# Patient Record
Sex: Female | Born: 1955 | Race: White | Hispanic: No | Marital: Married | Smoking: Never smoker
Health system: Southern US, Community
[De-identification: ages and names within clinical notes are randomized; demographics above are authoritative.]

## PROBLEM LIST (undated history)

## (undated) DIAGNOSIS — C679 Malignant neoplasm of bladder, unspecified: Secondary | ICD-10-CM

## (undated) DIAGNOSIS — T8859XA Other complications of anesthesia, initial encounter: Secondary | ICD-10-CM

## (undated) DIAGNOSIS — K635 Polyp of colon: Secondary | ICD-10-CM

## (undated) DIAGNOSIS — R0600 Dyspnea, unspecified: Secondary | ICD-10-CM

## (undated) DIAGNOSIS — Z95810 Presence of automatic (implantable) cardiac defibrillator: Secondary | ICD-10-CM

## (undated) DIAGNOSIS — F329 Major depressive disorder, single episode, unspecified: Secondary | ICD-10-CM

## (undated) DIAGNOSIS — K2970 Gastritis, unspecified, without bleeding: Secondary | ICD-10-CM

## (undated) DIAGNOSIS — K219 Gastro-esophageal reflux disease without esophagitis: Secondary | ICD-10-CM

## (undated) DIAGNOSIS — T4145XA Adverse effect of unspecified anesthetic, initial encounter: Secondary | ICD-10-CM

## (undated) DIAGNOSIS — F419 Anxiety disorder, unspecified: Secondary | ICD-10-CM

## (undated) DIAGNOSIS — I429 Cardiomyopathy, unspecified: Secondary | ICD-10-CM

## (undated) DIAGNOSIS — M1990 Unspecified osteoarthritis, unspecified site: Secondary | ICD-10-CM

## (undated) DIAGNOSIS — K5790 Diverticulosis of intestine, part unspecified, without perforation or abscess without bleeding: Secondary | ICD-10-CM

## (undated) DIAGNOSIS — I10 Essential (primary) hypertension: Secondary | ICD-10-CM

## (undated) DIAGNOSIS — K529 Noninfective gastroenteritis and colitis, unspecified: Secondary | ICD-10-CM

## (undated) DIAGNOSIS — M797 Fibromyalgia: Secondary | ICD-10-CM

## (undated) DIAGNOSIS — K8020 Calculus of gallbladder without cholecystitis without obstruction: Secondary | ICD-10-CM

## (undated) DIAGNOSIS — K5792 Diverticulitis of intestine, part unspecified, without perforation or abscess without bleeding: Secondary | ICD-10-CM

## (undated) DIAGNOSIS — Z86011 Personal history of benign neoplasm of the brain: Secondary | ICD-10-CM

## (undated) DIAGNOSIS — Z8619 Personal history of other infectious and parasitic diseases: Secondary | ICD-10-CM

## (undated) DIAGNOSIS — G4730 Sleep apnea, unspecified: Secondary | ICD-10-CM

## (undated) DIAGNOSIS — D126 Benign neoplasm of colon, unspecified: Secondary | ICD-10-CM

## (undated) DIAGNOSIS — F32A Depression, unspecified: Secondary | ICD-10-CM

## (undated) DIAGNOSIS — A048 Other specified bacterial intestinal infections: Secondary | ICD-10-CM

## (undated) DIAGNOSIS — R0609 Other forms of dyspnea: Secondary | ICD-10-CM

## (undated) DIAGNOSIS — I509 Heart failure, unspecified: Secondary | ICD-10-CM

## (undated) DIAGNOSIS — R011 Cardiac murmur, unspecified: Secondary | ICD-10-CM

## (undated) DIAGNOSIS — K589 Irritable bowel syndrome without diarrhea: Secondary | ICD-10-CM

## (undated) HISTORY — PX: ABDOMINAL HYSTERECTOMY: SHX81

## (undated) HISTORY — DX: Calculus of gallbladder without cholecystitis without obstruction: K80.20

## (undated) HISTORY — DX: Personal history of other infectious and parasitic diseases: Z86.19

## (undated) HISTORY — DX: Heart failure, unspecified: I50.9

## (undated) HISTORY — DX: Anxiety disorder, unspecified: F41.9

## (undated) HISTORY — DX: Dyspnea, unspecified: R06.00

## (undated) HISTORY — DX: Unspecified osteoarthritis, unspecified site: M19.90

## (undated) HISTORY — PX: UPPER GASTROINTESTINAL ENDOSCOPY: SHX188

## (undated) HISTORY — DX: Other specified bacterial intestinal infections: A04.8

## (undated) HISTORY — PX: COLONOSCOPY: SHX174

## (undated) HISTORY — DX: Major depressive disorder, single episode, unspecified: F32.9

## (undated) HISTORY — DX: Irritable bowel syndrome, unspecified: K58.9

## (undated) HISTORY — DX: Polyp of colon: K63.5

## (undated) HISTORY — PX: WISDOM TOOTH EXTRACTION: SHX21

## (undated) HISTORY — DX: Other forms of dyspnea: R06.09

## (undated) HISTORY — DX: Malignant neoplasm of bladder, unspecified: C67.9

## (undated) HISTORY — DX: Diverticulosis of intestine, part unspecified, without perforation or abscess without bleeding: K57.90

## (undated) HISTORY — DX: Gastritis, unspecified, without bleeding: K29.70

## (undated) HISTORY — DX: Personal history of benign neoplasm of the brain: Z86.011

## (undated) HISTORY — DX: Sleep apnea, unspecified: G47.30

## (undated) HISTORY — PX: CHOLECYSTECTOMY: SHX55

## (undated) HISTORY — DX: Diverticulitis of intestine, part unspecified, without perforation or abscess without bleeding: K57.92

## (undated) HISTORY — DX: Depression, unspecified: F32.A

## (undated) HISTORY — DX: Cardiac murmur, unspecified: R01.1

## (undated) HISTORY — DX: Gastro-esophageal reflux disease without esophagitis: K21.9

## (undated) HISTORY — DX: Benign neoplasm of colon, unspecified: D12.6

## (undated) HISTORY — PX: OTHER SURGICAL HISTORY: SHX169

## (undated) HISTORY — DX: Noninfective gastroenteritis and colitis, unspecified: K52.9

## (undated) SURGERY — COLONOSCOPY WITH ESOPHAGOGASTRODUODENOSCOPY (EGD)
Anesthesia: Monitor Anesthesia Care

---

## 1981-04-12 HISTORY — PX: CARDIAC CATHETERIZATION: SHX172

## 1999-04-13 HISTORY — PX: COLECTOMY: SHX59

## 2002-04-12 HISTORY — PX: BRAIN SURGERY: SHX531

## 2005-11-29 ENCOUNTER — Encounter: Payer: Self-pay | Admitting: Internal Medicine

## 2006-07-26 ENCOUNTER — Encounter: Payer: Self-pay | Admitting: Internal Medicine

## 2011-12-31 ENCOUNTER — Emergency Department (HOSPITAL_COMMUNITY): Payer: 59

## 2011-12-31 ENCOUNTER — Emergency Department (HOSPITAL_COMMUNITY)
Admission: EM | Admit: 2011-12-31 | Discharge: 2011-12-31 | Disposition: A | Discharge status: Home / Self Care | Payer: 59 | Attending: Emergency Medicine | Admitting: Emergency Medicine

## 2011-12-31 ENCOUNTER — Encounter (HOSPITAL_COMMUNITY): Payer: Self-pay | Admitting: Emergency Medicine

## 2011-12-31 DIAGNOSIS — Y93E1 Activity, personal bathing and showering: Secondary | ICD-10-CM | POA: Insufficient documentation

## 2011-12-31 DIAGNOSIS — W19XXXA Unspecified fall, initial encounter: Secondary | ICD-10-CM

## 2011-12-31 DIAGNOSIS — IMO0001 Reserved for inherently not codable concepts without codable children: Secondary | ICD-10-CM | POA: Insufficient documentation

## 2011-12-31 DIAGNOSIS — W1809XA Striking against other object with subsequent fall, initial encounter: Secondary | ICD-10-CM | POA: Insufficient documentation

## 2011-12-31 DIAGNOSIS — Y92009 Unspecified place in unspecified non-institutional (private) residence as the place of occurrence of the external cause: Secondary | ICD-10-CM | POA: Insufficient documentation

## 2011-12-31 DIAGNOSIS — Z9089 Acquired absence of other organs: Secondary | ICD-10-CM | POA: Insufficient documentation

## 2011-12-31 DIAGNOSIS — Z79899 Other long term (current) drug therapy: Secondary | ICD-10-CM | POA: Insufficient documentation

## 2011-12-31 DIAGNOSIS — M546 Pain in thoracic spine: Secondary | ICD-10-CM | POA: Insufficient documentation

## 2011-12-31 DIAGNOSIS — M542 Cervicalgia: Secondary | ICD-10-CM | POA: Insufficient documentation

## 2011-12-31 DIAGNOSIS — S299XXA Unspecified injury of thorax, initial encounter: Secondary | ICD-10-CM

## 2011-12-31 HISTORY — DX: Fibromyalgia: M79.7

## 2011-12-31 HISTORY — DX: Presence of automatic (implantable) cardiac defibrillator: Z95.810

## 2011-12-31 HISTORY — DX: Cardiomyopathy, unspecified: I42.9

## 2011-12-31 LAB — URINALYSIS, ROUTINE W REFLEX MICROSCOPIC
Glucose, UA: NEGATIVE mg/dL
Hgb urine dipstick: NEGATIVE
Ketones, ur: NEGATIVE mg/dL
Leukocytes, UA: NEGATIVE
Protein, ur: NEGATIVE mg/dL

## 2011-12-31 MED ORDER — CYCLOBENZAPRINE HCL 10 MG PO TABS: 10.0000 mg | ORAL_TABLET | Freq: Two times a day (BID) | ORAL | Status: DC | PRN

## 2011-12-31 NOTE — ED Provider Notes (Signed)
History     CSN: [id number]  Arrival date & time 12/31/11  1819   First MD Initiated Contact with Patient 12/31/11 1958      Chief Complaint  Patient presents with  . Fall    (Consider location/radiation/quality/duration/timing/severity/associated sxs/prior treatment) HPI...Marland Kitchenaccidental fall in the shower  and ultimately striking the toilet on the right side of her back.  No loss of consciousness. No obvious head or neck injury. Palpation makes pain worse. Severity is mild to moderate. No other associated symptoms. Accident happened a brief time ago.  Past Medical History  Diagnosis Date  . Cardiomyopathy   . ICD (implantable cardiac defibrillator) in place   . Fibromyalgia     Past Surgical History  Procedure Date  . Brain surgery   . Cholecystectomy     No family history on file.  History  Substance Use Topics  . Smoking status: Never Smoker   . Smokeless tobacco: Not on file  . Alcohol Use: No    OB History    Grav Para Term Preterm Abortions TAB SAB Ect Mult Living                  Review of Systems  All other systems reviewed and are negative.    Allergies  Codeine and Penicillins  Home Medications   Current Outpatient Rx  Name Route Sig Dispense Refill  . CARVEDILOL 25 MG PO TABS Oral Take 25 mg by mouth 2 (two) times daily with a meal.    . DULOXETINE HCL 60 MG PO CPEP Oral Take 60 mg by mouth daily.    Drema Balzarine HCL 200-25 MG PO CAPS Oral Take 2 tablets by mouth at bedtime as needed. For sleep.    Marland Kitchen LISINOPRIL 20 MG PO TABS Oral Take 20 mg by mouth 2 (two) times daily.    Marland Kitchen NAPROXEN SODIUM 220 MG PO TABS Oral Take 220 mg by mouth 2 (two) times daily as needed. For pain.    Marland Kitchen SPIRONOLACTONE 25 MG PO TABS Oral Take 25 mg by mouth daily.    . CYCLOBENZAPRINE HCL 10 MG PO TABS Oral Take 1 tablet (10 mg total) by mouth 2 (two) times daily as needed for muscle spasms. 20 tablet 0    BP 155/100  Pulse 93  Temp 98.2 F (36.8 C)   Resp 20  SpO2 97%  Physical Exam  Nursing note and vitals reviewed. Constitutional: She is oriented to person, place, and time. She appears well-developed and well-nourished.  HENT:  Head: Normocephalic and atraumatic.  Eyes: Conjunctivae normal and EOM are normal. Pupils are equal, round, and reactive to light.  Neck: Normal range of motion. Neck supple.  Cardiovascular: Normal rate, regular rhythm and normal heart sounds.   Pulmonary/Chest: Effort normal and breath sounds normal.  Abdominal: Soft. Bowel sounds are normal.  Musculoskeletal: Normal range of motion.       Minimal paraspinous tenderness from the cervical to lumbar spine.  Neurological: She is alert and oriented to person, place, and time.  Skin: Skin is warm and dry.  Psychiatric: She has a normal mood and affect.    ED Course  Procedures (including critical care time)  Labs Reviewed  URINALYSIS, ROUTINE W REFLEX MICROSCOPIC - Abnormal; Notable for the following:    Bilirubin Urine SMALL (*)     All other components within normal limits   Dg Cervical Spine Complete  12/31/2011  *RADIOLOGY REPORT*  Clinical Data: Fall, right neck pain  CERVICAL SPINE -  COMPLETE 4+ VIEW  Comparison: None.  Findings: Cervical spine is visualized to the bottom of C7 on the lateral view.  Normal cervical lordosis.  No evidence of fracture or dislocation.  Vertebral body heights are maintained.  Dens appears intact.  Lateral masses of C1 are symmetric.  No prevertebral soft tissue swelling.  Moderate degenerative changes of the lower cervical spine.  Bilateral neural foramina are patent.  Visualized lung apices are clear.  ICD leads, incompletely visualized.  IMPRESSION: No fracture or dislocation is seen.  Moderate degenerative changes.   Original Report Authenticated By: Charline Bills, M.D.    Dg Thoracic Spine 2 View  12/31/2011  *RADIOLOGY REPORT*  Clinical Data: Fall, back pain  THORACIC SPINE - 2 VIEW  Comparison: None.  Findings:  Normal thoracic kyphosis.  No evidence of fracture or dislocation.  The vertebral body heights are maintained.  Mild multilevel degenerative changes.  Left subclavian ICD, incompletely visualized.  IMPRESSION: No fracture or dislocation is seen.  Mild multilevel degenerative changes.   Original Report Authenticated By: Charline Bills, M.D.    Dg Lumbar Spine Complete  12/31/2011  *RADIOLOGY REPORT*  Clinical Data: Status post fall, back pain  LUMBAR SPINE - COMPLETE 4+ VIEW  Comparison: None.  Findings: Normal lumbar lordosis.  No evidence of fracture or dislocation.  Vertebral body heights are maintained.  Mild multilevel degenerative changes.  Visualized bony pelvis appears intact.  Surgical clips in the right upper abdomen.  Possible calcification overlying the left upper kidney, equivocal.  Moderate stool in the right colon and rectum.  IMPRESSION: No fracture or dislocation is seen.  Mild multilevel degenerative changes.   Original Report Authenticated By: Charline Bills, M.D.      1. Fall   2. Thoracic injury       MDM  Plain films of the cervical spine, thoracic spine, lumbar spine all negative for acute fracture. Vital signs are normal. No neurological deficits.        Donnetta Hutching, MD 01/07/12 [phone]

## 2011-12-31 NOTE — ED Notes (Signed)
Pt states she was trying to take a shower and she turned and slipped and hit her right side on the toilet. Pt states she did not loose consciousness and she did not hit her head. Pt is alert and oriented at this time. Pt states she has pain on her right lower back

## 2012-02-24 ENCOUNTER — Telehealth: Payer: Self-pay | Admitting: Internal Medicine

## 2012-02-24 NOTE — Telephone Encounter (Signed)
Per patient she is a relative of Keane Scrape and she was told by [employer] and [facility] would accept her and her husband as new patients and she is calling to schedule, please advise

## 2012-02-24 NOTE — Telephone Encounter (Signed)
OK. Thx

## 2012-03-01 NOTE — Telephone Encounter (Signed)
LMVM FOR PATIENT TO CALL BACK AND SCHEDULE

## 2012-03-06 NOTE — Telephone Encounter (Signed)
Left patient another voicemail to call back and schedule

## 2012-03-22 ENCOUNTER — Ambulatory Visit (INDEPENDENT_AMBULATORY_CARE_PROVIDER_SITE_OTHER): Payer: 59 | Admitting: Physician Assistant

## 2012-03-22 VITALS — BP 128/74 | HR 69 | Temp 97.9°F | Resp 16 | Ht 65.0 in | Wt 264.0 lb

## 2012-03-22 DIAGNOSIS — I428 Other cardiomyopathies: Secondary | ICD-10-CM

## 2012-03-22 DIAGNOSIS — I420 Dilated cardiomyopathy: Secondary | ICD-10-CM

## 2012-03-22 DIAGNOSIS — Z95810 Presence of automatic (implantable) cardiac defibrillator: Secondary | ICD-10-CM

## 2012-03-22 DIAGNOSIS — M79609 Pain in unspecified limb: Secondary | ICD-10-CM

## 2012-03-22 DIAGNOSIS — M79606 Pain in leg, unspecified: Secondary | ICD-10-CM

## 2012-03-22 LAB — POCT CBC
Granulocyte percent: 65.1 %G (ref 37–80)
MID (cbc): 0.6 (ref 0–0.9)
MPV: 9.7 fL (ref 0–99.8)
POC Granulocyte: 6.6 (ref 2–6.9)
POC MID %: 6.2 %M (ref 0–12)
Platelet Count, POC: 291 10*3/uL (ref 142–424)
RBC: 4.92 M/uL (ref 4.04–5.48)
RDW, POC: 15.1 %

## 2012-03-22 NOTE — Progress Notes (Signed)
Subjective:    Patient ID: Whitney Jimenez, female    DOB: Mar 29, 1956, 56 y.o.   MRN: [id number]  HPI   Whitney Jimenez is a very pleasant 56 yr old female with a complicated medical history.  She is here today due to concern for a blood clot in her leg.    She states that she has had "leg issues for quite awhile".  She has known meniscal tears, with constant pain.  Has seen Dr. Magnus Ivan because the pain in her knees and lower legs is really bad.  He recommended a course of physical therapy, which she has not done yet.  Has pain through shins.  Often feels like legs are going to give out.  Today, experienced a new pain in the anterior portion of her mid left thigh.  Has not had this pain before.  Trying to figure out what could cause it, has concern for a blood clot.  No redness or warmth of the leg.  No abnormal swelling, has baseline edema.  Has concern for PAD and has wanted to be tested, but has not done so.  Denies any personal or family history of blood clots.  No hormonal therapy.  Never smoker.  No recent travel.  Last travel was to [place] in October.  Does have chronic pain in lower legs.  States her legs ache.  Specifically denies cramping pain.  States standing will cause leg pain.  States she can only walk short distances, not able to walk from house to the curb to take out trash.  But today states that she was able to walk the dogs and do some raking in the yard.    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  Father deceased of HF, one living brother also with HF.  Pt has an ICD implanted, it was replaced in [place] within the last several months.  Denies that it has ever fired but states it has come close.  Has not had the device interrogated since the new one was implanted.  Pt has some level of chest discomfort on a daily basis, states nothing new or concerning.  Does endorse some recent SOB but states it's "nothing to write home about".    Also history of diverticular disease s/p  sigmoid colon resection.  Pt states she frequently has abd pain.    Some unintentional weight loss.  Has lost about 16 lbs this year.    Pt is new to the area, here from [place].  Trying to establish with Dr. Posey Rea as PCP, but may make an appt here at [facility] instead.  Has not established with a cardiologist locally yet.  Would like a female cardiologist if possible.    Review of Systems  Constitutional: Negative for fever and chills.  HENT: Negative.   Respiratory: Positive for shortness of breath (some recently, but "nothing to write home about") and wheezing (some when laying down). Negative for cough.   Cardiovascular: Positive for chest pain (always have some discomfort), palpitations and leg swelling.  Gastrointestinal: Positive for abdominal pain (chronic, hx diverticular disease). Negative for nausea and vomiting.  Musculoskeletal: Positive for arthralgias (knees).  Skin: Negative.   Neurological: Negative.        Objective:   Physical Exam  Vitals reviewed. Constitutional: She is oriented to person, place, and time. She appears well-developed and well-nourished. No distress.  HENT:  Head: Normocephalic and atraumatic.  Cardiovascular: Normal rate and regular rhythm.   Pulses:  Radial pulses are 2+ on the right side, and 2+ on the left side.       Dorsalis pedis pulses are 1+ on the right side, and 2+ on the left side.       Posterior tibial pulses are 1+ on the right side, and 1+ on the left side.  Pulmonary/Chest: Effort normal and breath sounds normal. She has no wheezes. She has no rhonchi. She has no rales.  Musculoskeletal:       Left upper leg: She exhibits no tenderness, no swelling and no edema.       Right lower leg: She exhibits edema. She exhibits no tenderness.       Left lower leg: She exhibits edema. She exhibits no tenderness.  Neurological: She is alert and oriented to person, place, and time.  Skin: Skin is warm and dry.  Psychiatric: She has a  normal mood and affect. Her behavior is normal.      Filed Vitals:   03/22/12 1636  BP: 128/74  Pulse: 69  Temp: 97.9 F (36.6 C)  Resp: 16  Body mass index is 43.93 kg/(m^2).     Results for orders placed in visit on 03/22/12  POCT CBC      Component Value Range   WBC 10.2  4.6 - 10.2 K/uL   Lymph, poc 2.9  0.6 - 3.4   POC LYMPH PERCENT 28.7  10 - 50 %L   MID (cbc) 0.6  0 - 0.9   POC MID % 6.2  0 - 12 %M   POC Granulocyte 6.6  2 - 6.9   Granulocyte percent 65.1  37 - 80 %G   RBC 4.92  4.04 - 5.48 M/uL   Hemoglobin 14.1  12.2 - 16.2 g/dL   HCT, POC 91.4  78.2 - 47.9 %   MCV 94.6  80 - 97 fL   MCH, POC 28.7  27 - 31.2 pg   MCHC 30.3 (*) 31.8 - 35.4 g/dL   RDW, POC 95.6     Platelet Count, POC 291  142 - 424 K/uL   MPV 9.7  0 - 99.8 fL       Assessment & Plan:   1. Leg pain  D-dimer, quantitative, [place] Extrem Low Left Comp, Ankle brachial index  2. Dilated cardiomyopathy  POCT CBC, Comprehensive metabolic panel, Ambulatory referral to Cardiology  3. ICD (implantable cardiac defibrillator) in place      Whitney Jimenez is a very pleasant 56 yr old female here today with new thigh pain that she felt was concerning for a blood clot.  Physical exam is reassuring.  Vitals are WNL, there is no redness, swelling, or TTP of the left leg.  Pain is in the anterior aspect of the thigh, not along the deep venous system.  She has no risk factors for DVT/PE.  Will do a d-dimer and lower extremity U/S to completely rule out a clot.  Her leg pain is complicated by known DJD of the knees as well as possible PVD.  Will do ABI per patient request.    Given pt's complicated medical history I encouraged her to establish with a PCP either here or elsewhere.  Additionally I put in a referral to Dr. Tenny Craw at [facility] as pt would prefer a female cardiologist.    Discussed specific RTC precautions - CP, SOB, worsening leg pain, etc.  Pt voices understanding and is in agreement with this  plan.    Spent 40 minutes  face to face with patient.

## 2012-03-22 NOTE — Patient Instructions (Addendum)
I will let you know when I have your lab results back from today -  blood count, metabolic panel, and d-dimer (which can indicate blood clot).  You will get a phone call when your ultrasound and ABI are scheduled.  I have put in a referral to Whitney Jimenez at [facility] on [facility].  If you are in need of a primary care provider, we would be happy to see you.  You can always walk in to the urgent care, or you can make an appointment next door at the appointment clinic.  I would encourage you to follow through with physical therapy for your knees, and then to follow-up with Whitney Jimenez.    If you develop new or worsening symptoms, come back in, or go to the emergency department.

## 2012-03-23 ENCOUNTER — Telehealth: Payer: Self-pay | Admitting: Radiology

## 2012-03-23 ENCOUNTER — Ambulatory Visit
Admission: RE | Admit: 2012-03-23 | Discharge: 2012-03-23 | Disposition: A | Discharge status: Home / Self Care | Payer: 59 | Source: Ambulatory Visit | Attending: Physician Assistant | Admitting: Physician Assistant

## 2012-03-23 DIAGNOSIS — M79606 Pain in leg, unspecified: Secondary | ICD-10-CM

## 2012-03-23 LAB — COMPREHENSIVE METABOLIC PANEL
ALT: 21 U/L (ref 0–35)
AST: 21 U/L (ref 0–37)
Albumin: 4 g/dL (ref 3.5–5.2)
Alkaline Phosphatase: 38 U/L — ABNORMAL LOW (ref 39–117)
Calcium: 9.2 mg/dL (ref 8.4–10.5)
Chloride: 102 mEq/L (ref 96–112)
Potassium: 4.3 mEq/L (ref 3.5–5.3)
Sodium: 141 mEq/L (ref 135–145)

## 2012-03-23 NOTE — Telephone Encounter (Signed)
Order corrected per Jenel Lucks, must be US Vascular, at hospital must be Venous Doppler, this is very confusing, same study different order.

## 2012-03-23 NOTE — Telephone Encounter (Signed)
Called patient, she needs to have her venous doppler study to r/o DVT today. I have left message for her to call me back ASAP

## 2012-03-24 NOTE — Telephone Encounter (Signed)
Patient would like someone to call her back to let her know what the next step is after the doppler study.  Best [phone]

## 2012-03-25 ENCOUNTER — Telehealth: Payer: Self-pay | Admitting: Physician Assistant

## 2012-03-25 DIAGNOSIS — M79606 Pain in leg, unspecified: Secondary | ICD-10-CM

## 2012-03-25 MED ORDER — CYCLOBENZAPRINE HCL 10 MG PO TABS: 10.0000 mg | ORAL_TABLET | Freq: Two times a day (BID) | ORAL | Status: DC | PRN

## 2012-03-25 MED ORDER — TRAMADOL HCL 50 MG PO TABS: 100.0000 mg | ORAL_TABLET | Freq: Four times a day (QID) | ORAL | Status: DC | PRN

## 2012-03-25 NOTE — Telephone Encounter (Signed)
Spoke with patient this morning.  States her thigh pain has resolved and is not bothering her any longer.  Continues to have lower leg pain.  Patient and husband were very concerned about the elevated d-dimer.  Discussed with patient that heart failure is a known cause of d-dimer elevation.  Venous U/S was completely negative for clot in the left leg, so she does not have a DVT.  Patient seemed upset that she had received an urgent call to schedule her U/S when the d-dimer came back elevated.  Apologized that we caused alarm, but explained that the person who received the critical lab value likely was not aware of her full history and just knew that she needed an ultrasound urgently to rule out clot.  Patient expressed understanding.  I have sent refills on flexeril and tramadol to the pharmacy for patient's ongoing lower leg pain.  Encouraged her to follow through with PT as they have additional pain relief modalities.  Patient voiced understanding and said she would follow through with this.  She will let us know if things are worsening or not improving.

## 2012-03-26 ENCOUNTER — Telehealth: Payer: Self-pay | Admitting: Family Medicine

## 2012-03-26 NOTE — Telephone Encounter (Signed)
Spoke to pt yesterday and documented in a telephone encounter

## 2012-03-26 NOTE — Telephone Encounter (Signed)
Called in flexeril to [facility] and left on pharmacy vm

## 2012-04-24 ENCOUNTER — Other Ambulatory Visit: Payer: 59

## 2012-04-24 ENCOUNTER — Other Ambulatory Visit (INDEPENDENT_AMBULATORY_CARE_PROVIDER_SITE_OTHER): Payer: 59

## 2012-04-24 ENCOUNTER — Encounter: Payer: Self-pay | Admitting: Internal Medicine

## 2012-04-24 ENCOUNTER — Ambulatory Visit (INDEPENDENT_AMBULATORY_CARE_PROVIDER_SITE_OTHER): Payer: 59 | Admitting: Internal Medicine

## 2012-04-24 VITALS — BP 118/70 | HR 72 | Temp 98.1°F | Ht 64.5 in | Wt 262.0 lb

## 2012-04-24 DIAGNOSIS — M79609 Pain in unspecified limb: Secondary | ICD-10-CM

## 2012-04-24 DIAGNOSIS — I420 Dilated cardiomyopathy: Secondary | ICD-10-CM

## 2012-04-24 DIAGNOSIS — D329 Benign neoplasm of meninges, unspecified: Secondary | ICD-10-CM | POA: Insufficient documentation

## 2012-04-24 DIAGNOSIS — G43909 Migraine, unspecified, not intractable, without status migrainosus: Secondary | ICD-10-CM

## 2012-04-24 DIAGNOSIS — IMO0001 Reserved for inherently not codable concepts without codable children: Secondary | ICD-10-CM

## 2012-04-24 DIAGNOSIS — K5730 Diverticulosis of large intestine without perforation or abscess without bleeding: Secondary | ICD-10-CM

## 2012-04-24 DIAGNOSIS — F32A Depression, unspecified: Secondary | ICD-10-CM

## 2012-04-24 DIAGNOSIS — L409 Psoriasis, unspecified: Secondary | ICD-10-CM

## 2012-04-24 DIAGNOSIS — K5790 Diverticulosis of intestine, part unspecified, without perforation or abscess without bleeding: Secondary | ICD-10-CM

## 2012-04-24 DIAGNOSIS — M797 Fibromyalgia: Secondary | ICD-10-CM

## 2012-04-24 DIAGNOSIS — L408 Other psoriasis: Secondary | ICD-10-CM

## 2012-04-24 DIAGNOSIS — Z23 Encounter for immunization: Secondary | ICD-10-CM

## 2012-04-24 DIAGNOSIS — D320 Benign neoplasm of cerebral meninges: Secondary | ICD-10-CM

## 2012-04-24 DIAGNOSIS — I428 Other cardiomyopathies: Secondary | ICD-10-CM

## 2012-04-24 DIAGNOSIS — M79606 Pain in leg, unspecified: Secondary | ICD-10-CM

## 2012-04-24 DIAGNOSIS — F329 Major depressive disorder, single episode, unspecified: Secondary | ICD-10-CM

## 2012-04-24 LAB — CBC WITH DIFFERENTIAL/PLATELET
Basophils Relative: 0.3 % (ref 0.0–3.0)
Eosinophils Absolute: 0.2 10*3/uL (ref 0.0–0.7)
Eosinophils Relative: 2.1 % (ref 0.0–5.0)
HCT: 41.2 % (ref 36.0–46.0)
Lymphs Abs: 2.2 10*3/uL (ref 0.7–4.0)
MCHC: 33.7 g/dL (ref 30.0–36.0)
MCV: 88.7 fl (ref 78.0–100.0)
Monocytes Absolute: 0.5 10*3/uL (ref 0.1–1.0)
Neutrophils Relative %: 66.7 % (ref 43.0–77.0)
Platelets: 226 10*3/uL (ref 150.0–400.0)

## 2012-04-24 LAB — LIPID PANEL
Cholesterol: 166 mg/dL (ref 0–200)
Total CHOL/HDL Ratio: 4
Triglycerides: 91 mg/dL (ref 0.0–149.0)
VLDL: 18.2 mg/dL (ref 0.0–40.0)

## 2012-04-24 LAB — BASIC METABOLIC PANEL
BUN: 19 mg/dL (ref 6–23)
Calcium: 9.1 mg/dL (ref 8.4–10.5)
GFR: 83.05 mL/min (ref >60.00)
Glucose, Bld: 130 mg/dL — ABNORMAL HIGH (ref 70–99)
Potassium: 4.2 mEq/L (ref 3.5–5.1)

## 2012-04-24 LAB — HEPATIC FUNCTION PANEL
ALT: 21 U/L (ref 0–35)
AST: 21 U/L (ref 0–37)
Albumin: 3.7 g/dL (ref 3.5–5.2)
Total Bilirubin: 0.4 mg/dL (ref 0.3–1.2)
Total Protein: 7.3 g/dL (ref 6.0–8.3)

## 2012-04-24 LAB — VITAMIN B12: Vitamin B-12: 666 pg/mL (ref 211–911)

## 2012-04-24 LAB — URINALYSIS
Bilirubin Urine: NEGATIVE
Hgb urine dipstick: NEGATIVE
Leukocytes, UA: NEGATIVE
Nitrite: NEGATIVE

## 2012-04-24 MED ORDER — TRAMADOL HCL 50 MG PO TABS: 100.0000 mg | ORAL_TABLET | Freq: Three times a day (TID) | ORAL | Status: DC | PRN

## 2012-04-24 MED ORDER — VITAMIN D 1000 UNITS PO TABS: 1000.0000 [IU] | ORAL_TABLET | Freq: Every day | ORAL | Status: DC

## 2012-04-24 NOTE — Assessment & Plan Note (Signed)
Continue with current prescription therapy as reflected on the Med list.  

## 2012-04-24 NOTE — Assessment & Plan Note (Signed)
She was d/c'd from [facility] care after 5 years

## 2012-04-24 NOTE — Progress Notes (Signed)
  Subjective:    Patient ID: Whitney Jimenez, female    DOB: [birth year], 57 y.o.   MRN: [id number]  HPI  New pt  C/o cardiomyopathy /hereditary - ICD since 2001 Dr Tenny Craw; CHF She has a h/o brain tumor C/o fibromyalgia sx's  X long time - not well controlled   Review of Systems  Constitutional: Negative for chills, activity change, appetite change, fatigue and unexpected weight change.  HENT: Negative for congestion, mouth sores and sinus pressure.   Eyes: Negative for visual disturbance.  Respiratory: Negative for cough and chest tightness.   Gastrointestinal: Negative for nausea and abdominal pain.  Genitourinary: Negative for frequency, difficulty urinating and vaginal pain.  Musculoskeletal: Negative for back pain and gait problem.  Skin: Negative for pallor and rash.  Neurological: Negative for dizziness, tremors, weakness, numbness and headaches.  Psychiatric/Behavioral: Negative for confusion and sleep disturbance.       Objective:   Physical Exam  Constitutional: She appears well-developed. No distress.       obese  HENT:  Head: Normocephalic.  Right Ear: External ear normal.  Left Ear: External ear normal.  Nose: Nose normal.  Mouth/Throat: Oropharynx is clear and moist.  Eyes: Conjunctivae normal are normal. Pupils are equal, round, and reactive to light. Right eye exhibits no discharge. Left eye exhibits no discharge.  Neck: Normal range of motion. Neck supple. No JVD present. No tracheal deviation present. No thyromegaly present.  Cardiovascular: Normal rate, regular rhythm and normal heart sounds.   Pulmonary/Chest: No stridor. No respiratory distress. She has no wheezes.  Abdominal: Soft. Bowel sounds are normal. She exhibits no distension and no mass. There is no tenderness. There is no rebound and no guarding.  Musculoskeletal: She exhibits no edema and no tenderness.  Lymphadenopathy:    She has no cervical adenopathy.  Neurological: She displays normal  reflexes. No cranial nerve deficit. She exhibits normal muscle tone. Coordination normal.  Skin: No rash noted. No erythema.  Psychiatric: She has a normal mood and affect. Her behavior is normal. Judgment and thought content normal.    Lab Results  Component Value Date   WBC 8.7 04/24/2012   HGB 13.9 04/24/2012   HCT 41.2 04/24/2012   PLT 226.0 04/24/2012   GLUCOSE 130* 04/24/2012   CHOL 166 04/24/2012   TRIG 91.0 04/24/2012   HDL 39.10 04/24/2012   LDLCALC 109* 04/24/2012   ALT 21 04/24/2012   AST 21 04/24/2012   NA 140 04/24/2012   K 4.2 04/24/2012   CL 105 04/24/2012   CREATININE 0.8 04/24/2012   BUN 19 04/24/2012   CO2 28 04/24/2012   TSH 0.95 04/24/2012    Chart reviewed      Assessment & Plan:

## 2012-04-24 NOTE — Assessment & Plan Note (Signed)
F/u w/Dr Tenny Craw Continue with current prescription therapy as reflected on the Med list.

## 2012-04-24 NOTE — Assessment & Plan Note (Signed)
Vit D Tramadol prn Cymbalta daily Labs

## 2012-04-25 ENCOUNTER — Telehealth: Payer: Self-pay | Admitting: Internal Medicine

## 2012-04-25 DIAGNOSIS — E559 Vitamin D deficiency, unspecified: Secondary | ICD-10-CM

## 2012-04-25 MED ORDER — ERGOCALCIFEROL 1.25 MG (50000 UT) PO CAPS: 50000.0000 [IU] | ORAL_CAPSULE | ORAL | Status: DC

## 2012-04-25 NOTE — Telephone Encounter (Signed)
Left mess for patient to call back.  

## 2012-04-25 NOTE — Telephone Encounter (Signed)
Whitney Jimenez, please, inform patient that all labs are normal except for low  Vit D - stat Rx and elev gl - loose wt Thx

## 2012-04-26 ENCOUNTER — Telehealth: Payer: Self-pay | Admitting: *Deleted

## 2012-04-26 NOTE — Telephone Encounter (Signed)
PATIENT NOTIFIED OF LAB RESULTS

## 2012-04-28 ENCOUNTER — Encounter: Payer: Self-pay | Admitting: Internal Medicine

## 2012-04-28 NOTE — Telephone Encounter (Signed)
Pt informed of below by Hoy Register, LPN

## 2012-05-01 ENCOUNTER — Ambulatory Visit (INDEPENDENT_AMBULATORY_CARE_PROVIDER_SITE_OTHER): Payer: 59 | Admitting: *Deleted

## 2012-05-01 ENCOUNTER — Telehealth: Payer: Self-pay | Admitting: Internal Medicine

## 2012-05-01 ENCOUNTER — Encounter: Payer: Self-pay | Admitting: Internal Medicine

## 2012-05-01 ENCOUNTER — Ambulatory Visit (INDEPENDENT_AMBULATORY_CARE_PROVIDER_SITE_OTHER): Payer: 59 | Admitting: Internal Medicine

## 2012-05-01 VITALS — BP 140/80 | HR 64 | Resp 20 | Ht 65.0 in | Wt 264.0 lb

## 2012-05-01 DIAGNOSIS — I428 Other cardiomyopathies: Secondary | ICD-10-CM

## 2012-05-01 DIAGNOSIS — I422 Other hypertrophic cardiomyopathy: Secondary | ICD-10-CM

## 2012-05-01 DIAGNOSIS — R0602 Shortness of breath: Secondary | ICD-10-CM

## 2012-05-01 DIAGNOSIS — I420 Dilated cardiomyopathy: Secondary | ICD-10-CM

## 2012-05-01 LAB — BASIC METABOLIC PANEL
BUN: 20 mg/dL (ref 6–23)
Calcium: 9.5 mg/dL (ref 8.4–10.5)
Creatinine, Ser: 0.8 mg/dL (ref 0.4–1.2)
GFR: 81.8 mL/min (ref >60.00)
Glucose, Bld: 115 mg/dL — ABNORMAL HIGH (ref 70–99)

## 2012-05-01 LAB — BRAIN NATRIURETIC PEPTIDE: Pro B Natriuretic peptide (BNP): 46 pg/mL (ref 0.0–100.0)

## 2012-05-01 NOTE — Telephone Encounter (Signed)
ROI faxed to [facility] in [place]  @ the [facility] Location [phone] ( MR Fax) 05/01/12/KM

## 2012-05-01 NOTE — Patient Instructions (Signed)
Lab work today We will call you with results.  Schedule new patient appointment with Dr.Taylor for device check in 3 months

## 2012-05-01 NOTE — Progress Notes (Signed)
HPI Patient is a 57 yo who is referred for continued cardiac care She recently moved to [place] from [place]. She has a history of NICM  She was diagnosed the the early 2000s.  At that time, in a preop eval, her heart was noted to be larger on Xray  She went on to have an echo that reportedly showed a depressed EF  Records are currently not available  She was placed on medicines  In 2001 she had an AICD placed.  The was recalled and changed to a [employer] device.  She went through her 3rd change out in NOvember. She has never had a CHF exacerbation.   She denies CP  No signif SOB  She has noted recently feeling her hart beat harder at night, not faster. She had one episode in November of falling on bed  Not sure if she just fell asleep  Does not remember device firing  Thinks it has paced at times.  She reports in 1982 undergoing surg  Told she devel a rate related R block while on table.  Was lost transiently   Allergies  Allergen Reactions  . Codeine Nausea And Vomiting  . Penicillins Nausea And Vomiting    Current Outpatient Prescriptions  Medication Sig Dispense Refill  . carvedilol (COREG) 25 MG tablet Take 25 mg by mouth 2 (two) times daily with a meal.      . clobetasol ointment (TEMOVATE) 0.05 % Apply topically. As needed      . cyclobenzaprine (FLEXERIL) 10 MG tablet Take 1 tablet (10 mg total) by mouth 2 (two) times daily as needed for muscle spasms.  20 tablet  0  . desonide (DESOWEN) 0.05 % ointment As needed      . DULoxetine (CYMBALTA) 60 MG capsule Take 60 mg by mouth daily.      . ergocalciferol (VITAMIN D2) 50000 UNITS capsule Take 1 capsule (50,000 Units total) by mouth once a week.  6 capsule  0  . fluocinonide ointment (LIDEX) 0.05 % Apply topically. Solution as needed      . furosemide (LASIX) 20 MG tablet Take 20 mg by mouth daily.      . Ibuprofen-Diphenhydramine HCl 200-25 MG CAPS Take 2 tablets by mouth at bedtime as needed. For sleep.      Marland Kitchen lisinopril  (PRINIVIL,ZESTRIL) 20 MG tablet Take 20 mg by mouth 2 (two) times daily.      . metroNIDAZOLE (FLAGYL) 500 MG tablet Take  As directed if needed      . naproxen sodium (ANAPROX) 220 MG tablet Take 220 mg by mouth 2 (two) times daily as needed. For pain.      Marland Kitchen spironolactone (ALDACTONE) 25 MG tablet Take 25 mg by mouth daily.      . traMADol (ULTRAM) 50 MG tablet Take 2 tablets (100 mg total) by mouth every 8 (eight) hours as needed for pain.  100 tablet  2    Past Medical History  Diagnosis Date  . Cardiomyopathy   . ICD (implantable cardiac defibrillator) in place   . Fibromyalgia   . CHF (congestive heart failure)   . Heart murmur     Past Surgical History  Procedure Date  . Brain surgery 2004    Tumor  . Cholecystectomy   . Abdominal hysterectomy   . Cardiac catheterization 1983  . Colectomy 2001    sigmoid    History reviewed. No pertinent family history.  History   Social History  . Marital Status:  Married    Spouse Name: N/A    Number of Children: N/A  . Years of Education: N/A   Occupational History  . Not on file.   Social History Main Topics  . Smoking status: Never Smoker   . Smokeless tobacco: Not on file  . Alcohol Use: No  . Drug Use: No  . Sexually Active:    Other Topics Concern  . Not on file   Social History Narrative  . No narrative on file    Review of Systems:  All systems reviewed.  They are negative to the above problem except as previously stated.  Vital Signs: BP 140/80  Pulse 64  Resp 20  Ht 5\' 5"  (1.651 m)  Wt 264 lb (119.75 kg)  BMI 43.93 kg/m2  Physical Exam Patient is a morbidly obese 57 yo in NAD HEENT:  Normocephalic, atraumatic. EOMI, PERRLA.  Neck: JVP is normal.  No bruits.  Lungs: clear to auscultation. No rales no wheezes.  Heart: Regular rate and rhythm. Normal S1, S2. No S3.   No significant murmurs. PMI not displaced.  Abdomen:  Supple, nontender. Normal bowel sounds. No masses. No hepatomegaly.   Extremities:   Good distal pulses throughout. No lower extremity edema.  Musculoskeletal :moving all extremities.  Neuro:   alert and oriented x3.  CN II-XII grossly intact.  EKG  SR 64  LBBB. Assessment and Plan:  1.  Cardiomyopathy.  Need to get records   Appears to be familial as other members have.  She says that genetic testing has not been done  Volume status looks good,  Will get labs today esp since BP is up  Will net sched other tests for now.  2.  ICD  Device interrogated.  No hx of firing.  Does show that her impedence has been up and now is improving.  Patient will be set up in device clnic in 3 months with Dr .Ladona Ridgel  3.  Health care maintenance  Will need to check on lipids.  Need to counsel on wt.

## 2012-05-02 ENCOUNTER — Encounter: Payer: Self-pay | Admitting: Internal Medicine

## 2012-05-02 ENCOUNTER — Other Ambulatory Visit (INDEPENDENT_AMBULATORY_CARE_PROVIDER_SITE_OTHER): Payer: 59

## 2012-05-02 ENCOUNTER — Telehealth: Payer: Self-pay | Admitting: Internal Medicine

## 2012-05-02 ENCOUNTER — Other Ambulatory Visit: Payer: Self-pay | Admitting: *Deleted

## 2012-05-02 ENCOUNTER — Ambulatory Visit (INDEPENDENT_AMBULATORY_CARE_PROVIDER_SITE_OTHER)
Admission: RE | Admit: 2012-05-02 | Discharge: 2012-05-02 | Disposition: A | Discharge status: Home / Self Care | Payer: 59 | Source: Ambulatory Visit | Attending: Internal Medicine | Admitting: Internal Medicine

## 2012-05-02 DIAGNOSIS — I422 Other hypertrophic cardiomyopathy: Secondary | ICD-10-CM

## 2012-05-02 LAB — ICD DEVICE OBSERVATION
AL IMPEDENCE ICD: 532 Ohm
AL THRESHOLD: 1 V
ATRIAL PACING ICD: 9.1 pct
DEV-0020ICD: NEGATIVE
RV LEAD IMPEDENCE ICD: 589 Ohm
RV LEAD THRESHOLD: 0.5 V
TZON-0003FASTVT: 270.2 ms

## 2012-05-02 NOTE — Telephone Encounter (Signed)
Records Received From [facility] Gave to [facility] 05/02/12/KM

## 2012-05-02 NOTE — Progress Notes (Signed)
icd check in clinic/new pt/seeing dr Tenny Craw

## 2012-05-03 ENCOUNTER — Telehealth: Payer: Self-pay | Admitting: Internal Medicine

## 2012-05-03 NOTE — Telephone Encounter (Signed)
Pt was given results re test and has further questions

## 2012-05-04 ENCOUNTER — Ambulatory Visit (HOSPITAL_COMMUNITY): Payer: 59 | Attending: Cardiology | Admitting: Radiology

## 2012-05-04 ENCOUNTER — Other Ambulatory Visit: Payer: Self-pay

## 2012-05-04 DIAGNOSIS — I080 Rheumatic disorders of both mitral and aortic valves: Secondary | ICD-10-CM | POA: Insufficient documentation

## 2012-05-04 DIAGNOSIS — I422 Other hypertrophic cardiomyopathy: Secondary | ICD-10-CM

## 2012-05-04 DIAGNOSIS — R011 Cardiac murmur, unspecified: Secondary | ICD-10-CM | POA: Insufficient documentation

## 2012-05-04 DIAGNOSIS — I509 Heart failure, unspecified: Secondary | ICD-10-CM | POA: Insufficient documentation

## 2012-05-04 DIAGNOSIS — I428 Other cardiomyopathies: Secondary | ICD-10-CM | POA: Insufficient documentation

## 2012-05-04 NOTE — Progress Notes (Signed)
Echocardiogram performed.  

## 2012-05-09 ENCOUNTER — Telehealth: Payer: Self-pay | Admitting: Internal Medicine

## 2012-05-09 NOTE — Telephone Encounter (Signed)
2nd Set of Records received Via Mail Gave to [facility] 05/09/12/KM

## 2012-05-10 ENCOUNTER — Other Ambulatory Visit: Payer: Self-pay | Admitting: *Deleted

## 2012-05-10 DIAGNOSIS — R7989 Other specified abnormal findings of blood chemistry: Secondary | ICD-10-CM

## 2012-05-10 NOTE — Progress Notes (Signed)
Pt given results of ECHO and Labs.  Will schedule for V/Q scan per Dr. Tenny Craw.

## 2012-05-11 ENCOUNTER — Encounter (HOSPITAL_COMMUNITY): Payer: Self-pay

## 2012-05-11 ENCOUNTER — Ambulatory Visit (HOSPITAL_COMMUNITY)
Admission: RE | Admit: 2012-05-11 | Discharge: 2012-05-11 | Disposition: A | Discharge status: Home / Self Care | Payer: 59 | Source: Ambulatory Visit | Attending: Internal Medicine | Admitting: Internal Medicine

## 2012-05-11 DIAGNOSIS — Z95810 Presence of automatic (implantable) cardiac defibrillator: Secondary | ICD-10-CM | POA: Insufficient documentation

## 2012-05-11 DIAGNOSIS — R7989 Other specified abnormal findings of blood chemistry: Secondary | ICD-10-CM

## 2012-05-11 DIAGNOSIS — I422 Other hypertrophic cardiomyopathy: Secondary | ICD-10-CM | POA: Insufficient documentation

## 2012-05-11 DIAGNOSIS — R0602 Shortness of breath: Secondary | ICD-10-CM | POA: Insufficient documentation

## 2012-05-11 DIAGNOSIS — R799 Abnormal finding of blood chemistry, unspecified: Secondary | ICD-10-CM | POA: Insufficient documentation

## 2012-05-11 MED ORDER — TECHNETIUM TO 99M ALBUMIN AGGREGATED
4.9000 | Freq: Once | INTRAVENOUS | Status: AC | PRN
  Administered 2012-05-11: 4.9 via INTRAVENOUS

## 2012-05-11 MED ORDER — TECHNETIUM TC 99M DIETHYLENETRIAME-PENTAACETIC ACID: 42.3000 | Freq: Once | INTRAVENOUS | Status: AC | PRN

## 2012-05-12 NOTE — Telephone Encounter (Signed)
Pt advised that we have not received her VQ scan results yet and that we will contact her with results when they become available, she verbalized understanding.

## 2012-05-12 NOTE — Telephone Encounter (Signed)
Follow up on call     VQ scan done at [facility] long on yesterday calling for test results .

## 2012-05-15 ENCOUNTER — Telehealth: Payer: Self-pay | Admitting: Internal Medicine

## 2012-05-15 NOTE — Telephone Encounter (Signed)
Pt calling re test results and to find out if any further testing needs to be done

## 2012-05-28 ENCOUNTER — Other Ambulatory Visit: Payer: Self-pay

## 2012-06-16 ENCOUNTER — Ambulatory Visit: Payer: 59 | Admitting: Internal Medicine

## 2012-07-25 ENCOUNTER — Ambulatory Visit (INDEPENDENT_AMBULATORY_CARE_PROVIDER_SITE_OTHER): Payer: 59 | Admitting: Internal Medicine

## 2012-07-25 ENCOUNTER — Encounter: Payer: Self-pay | Admitting: Internal Medicine

## 2012-07-25 VITALS — BP 128/70 | HR 72 | Temp 98.2°F | Resp 16 | Wt 264.0 lb

## 2012-07-25 DIAGNOSIS — I420 Dilated cardiomyopathy: Secondary | ICD-10-CM

## 2012-07-25 DIAGNOSIS — IMO0001 Reserved for inherently not codable concepts without codable children: Secondary | ICD-10-CM

## 2012-07-25 DIAGNOSIS — E559 Vitamin D deficiency, unspecified: Secondary | ICD-10-CM

## 2012-07-25 DIAGNOSIS — F329 Major depressive disorder, single episode, unspecified: Secondary | ICD-10-CM

## 2012-07-25 DIAGNOSIS — M797 Fibromyalgia: Secondary | ICD-10-CM

## 2012-07-25 DIAGNOSIS — I428 Other cardiomyopathies: Secondary | ICD-10-CM

## 2012-07-25 NOTE — Assessment & Plan Note (Signed)
Treated Cont Rx

## 2012-07-25 NOTE — Assessment & Plan Note (Signed)
Continue with current prescription therapy as reflected on the Med list.  

## 2012-07-25 NOTE — Progress Notes (Signed)
   Subjective:    HPI    C/o cardiomyopathy /hereditary - ICD since 2001 Dr Tenny Craw; CHF, elev glucose, vit D def She has a h/o brain tumor C/o fibromyalgia sx's  x long time - not well controlled  Wt Readings from Last 3 Encounters:  07/25/12 264 lb (119.75 kg)  05/01/12 264 lb (119.75 kg)  04/24/12 262 lb (118.842 kg)   BP Readings from Last 3 Encounters:  07/25/12 128/70  05/01/12 140/80  04/24/12 118/70      Review of Systems  Constitutional: Negative for chills, activity change, appetite change, fatigue and unexpected weight change.  HENT: Negative for congestion, mouth sores and sinus pressure.   Eyes: Negative for visual disturbance.  Respiratory: Negative for cough and chest tightness.   Gastrointestinal: Negative for nausea and abdominal pain.  Genitourinary: Negative for frequency, difficulty urinating and vaginal pain.  Musculoskeletal: Negative for back pain and gait problem.  Skin: Negative for pallor and rash.  Neurological: Negative for dizziness, tremors, weakness, numbness and headaches.  Psychiatric/Behavioral: Negative for confusion and sleep disturbance.       Objective:   Physical Exam  Constitutional: She appears well-developed. No distress.  obese  HENT:  Head: Normocephalic.  Right Ear: External ear normal.  Left Ear: External ear normal.  Nose: Nose normal.  Mouth/Throat: Oropharynx is clear and moist.  Eyes: Conjunctivae are normal. Pupils are equal, round, and reactive to light. Right eye exhibits no discharge. Left eye exhibits no discharge.  Neck: Normal range of motion. Neck supple. No JVD present. No tracheal deviation present. No thyromegaly present.  Cardiovascular: Normal rate, regular rhythm and normal heart sounds.   Pulmonary/Chest: No stridor. No respiratory distress. She has no wheezes.  Abdominal: Soft. Bowel sounds are normal. She exhibits no distension and no mass. There is no tenderness. There is no rebound and no guarding.   Musculoskeletal: She exhibits no edema and no tenderness.  Lymphadenopathy:    She has no cervical adenopathy.  Neurological: She displays normal reflexes. No cranial nerve deficit. She exhibits normal muscle tone. Coordination normal.  Skin: No rash noted. No erythema.  Psychiatric: She has a normal mood and affect. Her behavior is normal. Judgment and thought content normal.    Lab Results  Component Value Date   WBC 8.7 04/24/2012   HGB 13.9 04/24/2012   HCT 41.2 04/24/2012   PLT 226.0 04/24/2012   GLUCOSE 115* 05/01/2012   CHOL 166 04/24/2012   TRIG 91.0 04/24/2012   HDL 39.10 04/24/2012   LDLCALC 109* 04/24/2012   ALT 21 04/24/2012   AST 21 04/24/2012   NA 138 05/01/2012   K 4.2 05/01/2012   CL 100 05/01/2012   CREATININE 0.8 05/01/2012   BUN 20 05/01/2012   CO2 30 05/01/2012   TSH 0.95 04/24/2012   HGBA1C 6.2 05/01/2012    Chart reviewed      Assessment & Plan:

## 2012-08-03 ENCOUNTER — Ambulatory Visit (INDEPENDENT_AMBULATORY_CARE_PROVIDER_SITE_OTHER): Payer: 59 | Admitting: Internal Medicine

## 2012-08-03 VITALS — BP 128/74 | HR 61 | Ht 65.0 in | Wt 271.0 lb

## 2012-08-03 DIAGNOSIS — I420 Dilated cardiomyopathy: Secondary | ICD-10-CM

## 2012-08-03 DIAGNOSIS — I428 Other cardiomyopathies: Secondary | ICD-10-CM

## 2012-08-03 LAB — LIPID PANEL
Cholesterol: 164 mg/dL (ref 0–200)
LDL Cholesterol: 102 mg/dL — ABNORMAL HIGH (ref 0–99)
Triglycerides: 140 mg/dL (ref 0.0–149.0)
VLDL: 28 mg/dL (ref 0.0–40.0)

## 2012-08-03 LAB — BASIC METABOLIC PANEL
Chloride: 101 mEq/L (ref 96–112)
Potassium: 4.2 mEq/L (ref 3.5–5.1)

## 2012-08-03 NOTE — Progress Notes (Signed)
HPI Patient is a 57 yo with a history of NICM  I saw her for the first time earlier this winter  SHe had moved her from [place] She was diagnosed with CHF in 2000.  LVEF was initially in 20s  She has had a cardiac cath in 2009 which showd no CAD.  Last echo LVEF was 45% on echo in Jan 2013 She has known LBBB (old, present by report in 1082)  She has had 3 AICDs.  First rplaced due to faulty lead in 2005.  Last was put in in Fall 2013.  (ERI) She had never had a CHF exacerbation  When I saw her earlier this winter her volume status looked good After seeing her I recomm an echo  This was done and showed an LVEF of 25 to 30%. The patient says she does get tired/SOB easy now.  She also notes her heart pounding, especially in bed.  Difficult history  I do not sense that spells prolonged nor are they associated with dizziness  Gets a little SOB with them.  Happening more frequatnly  She was very surprised to hear her echo results  Worried  Wondering if she will ever need an LVAD  When seen on last visit a repeat D Dimer was done  This was increased.  ANA and ESR were without signif abnormality.  V/Q scan was negative Optomitrist commented on cotton wool spots.  Retinal specialist was not impressed with dx.   Allergies  Allergen Reactions  . Codeine Nausea And Vomiting  . Penicillins Nausea And Vomiting    Current Outpatient Prescriptions  Medication Sig Dispense Refill  . carvedilol (COREG) 25 MG tablet Take 25 mg by mouth 2 (two) times daily with a meal.      . clobetasol ointment (TEMOVATE) 0.05 % Apply topically. As needed      . cyclobenzaprine (FLEXERIL) 10 MG tablet Take 1 tablet (10 mg total) by mouth 2 (two) times daily as needed for muscle spasms.  20 tablet  0  . desonide (DESOWEN) 0.05 % ointment As needed      . DULoxetine (CYMBALTA) 60 MG capsule Take 60 mg by mouth daily.      . fluocinonide ointment (LIDEX) 0.05 % Apply topically. Solution as needed      . furosemide (LASIX) 20 MG tablet  Take 20 mg by mouth daily.      Marland Kitchen lisinopril (PRINIVIL,ZESTRIL) 20 MG tablet Take 20 mg by mouth 2 (two) times daily.      . metroNIDAZOLE (FLAGYL) 500 MG tablet Take  As directed if needed      . naproxen sodium (ANAPROX) 220 MG tablet Take 220 mg by mouth 2 (two) times daily as needed. For pain.      Marland Kitchen spironolactone (ALDACTONE) 25 MG tablet Take 25 mg by mouth daily.      . traMADol (ULTRAM) 50 MG tablet Take 2 tablets (100 mg total) by mouth every 8 (eight) hours as needed for pain.  100 tablet  2   No current facility-administered medications for this visit.    Past Medical History  Diagnosis Date  . Cardiomyopathy   . ICD (implantable cardiac defibrillator) in place   . Fibromyalgia   . CHF (congestive heart failure)   . Heart murmur     Past Surgical History  Procedure Laterality Date  . Brain surgery  2004    Tumor  . Cholecystectomy    . Abdominal hysterectomy    . Cardiac catheterization  8  . Colectomy  2001    sigmoid    No family history on file.  History   Social History  . Marital Status: Married    Spouse Name: N/A    Number of Children: N/A  . Years of Education: N/A   Occupational History  . Not on file.   Social History Main Topics  . Smoking status: Never Smoker   . Smokeless tobacco: Not on file  . Alcohol Use: No  . Drug Use: No  . Sexually Active:    Other Topics Concern  . Not on file   Social History Narrative  . No narrative on file    Review of Systems:  All systems reviewed.  They are negative to the above problem except as previously stated.  Vital Signs: BP 128/74  Pulse 61  Ht 5\' 5"  (1.651 m)  Wt 271 lb (122.925 kg)  BMI 45.1 kg/m2  Physical Exam Patient is an obese 57 yo in NAD HEENT:  Normocephalic, atraumatic. EOMI, PERRLA.  Neck: JVP is normal.  No bruits.  Lungs: clear to auscultation. No rales no wheezes.  Heart: Regular rate and rhythm. Normal S1, S2. No S3.   No significant murmurs. PMI not displaced.   Abdomen:  Supple, nontender. Normal bowel sounds. No obvious masses. No hepatomegaly.  Extremities:   Good distal pulses throughout. No lower extremity edema.  Musculoskeletal :moving all extremities.  Neuro:   alert and oriented x3.  CN II-XII grossly intact.   Assessment and Plan:  1.  NICM.  Volume status is good.  Patient anxious with change in EF  I had reviewed  She seems more symptomatic knoing LVEF is down I would keep on same regimen for now.  May push further but I would like her to be seen by EP first.  2.  Palpitations.  Patient will have device interrogated.  Will check labs  3.  HCM  Will check lipids

## 2012-08-03 NOTE — Patient Instructions (Addendum)
Labs today:  BMET, Lipids, BNP  Keep appt with Dr. Ladona Ridgel tomorrow.

## 2012-08-04 ENCOUNTER — Encounter: Payer: Self-pay | Admitting: Internal Medicine

## 2012-08-04 ENCOUNTER — Ambulatory Visit (INDEPENDENT_AMBULATORY_CARE_PROVIDER_SITE_OTHER): Payer: 59 | Admitting: Internal Medicine

## 2012-08-04 VITALS — BP 153/86 | HR 74 | Ht 65.0 in | Wt 272.0 lb

## 2012-08-04 DIAGNOSIS — I447 Left bundle-branch block, unspecified: Secondary | ICD-10-CM

## 2012-08-04 DIAGNOSIS — I5022 Chronic systolic (congestive) heart failure: Secondary | ICD-10-CM

## 2012-08-04 DIAGNOSIS — Z95810 Presence of automatic (implantable) cardiac defibrillator: Secondary | ICD-10-CM

## 2012-08-04 DIAGNOSIS — I420 Dilated cardiomyopathy: Secondary | ICD-10-CM

## 2012-08-04 DIAGNOSIS — I428 Other cardiomyopathies: Secondary | ICD-10-CM

## 2012-08-04 LAB — ICD DEVICE OBSERVATION
AL AMPLITUDE: 3 mv
AL IMPEDENCE ICD: 513 Ohm
RV LEAD IMPEDENCE ICD: 570 Ohm
RV LEAD THRESHOLD: 0.5 V
TZON-0003FASTVT: 270.2 ms

## 2012-08-04 NOTE — Patient Instructions (Addendum)
Your physician wants you to follow-up in: 12 months with Dr. Taylor. You will receive a reminder letter in the mail two months in advance. If you don't receive a letter, please call our office to schedule the follow-up appointment.    

## 2012-08-05 ENCOUNTER — Encounter: Payer: Self-pay | Admitting: Internal Medicine

## 2012-08-05 DIAGNOSIS — I447 Left bundle-branch block, unspecified: Secondary | ICD-10-CM | POA: Insufficient documentation

## 2012-08-05 DIAGNOSIS — I5022 Chronic systolic (congestive) heart failure: Secondary | ICD-10-CM | POA: Insufficient documentation

## 2012-08-05 MED ORDER — CARVEDILOL 25 MG PO TABS: 25.0000 mg | ORAL_TABLET | Freq: Two times a day (BID) | ORAL | Status: DC

## 2012-08-05 NOTE — Assessment & Plan Note (Signed)
The patient's Medtronic ICD is working normally. We'll plan to recheck in several months.

## 2012-08-05 NOTE — Assessment & Plan Note (Signed)
The patient's congestive heart failure has worsened from class II to class III. Her ejection fraction has gone from 45% to 25%. She has left bundle branch block and a QRS duration of 170 ms. She has had 3 prior defibrillator surgeries. Today we discussed the treatment options. Her blood pressure is somewhat elevated raising the possibility for up titration of her beta blocker. Upgrade to a biventricular ICD would also be an appropriate consideration. I counseled her thoroughly on the possibility of complications from the procedure. She has already had a new lead placed previously. 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 She is considering her options, and will call a day she wishes to proceed with device upgrade.

## 2012-08-05 NOTE — Progress Notes (Signed)
HPI Whitney Jimenez is referred today by Dr. Tenny Craw for consideration for upgrade to a biventricular ICD. She has a long-standing history of a nonischemic cardiomyopathy, and is status post ICD implantation, initially in 2001, with a new device placed in 2005 and a most recent device placed 6 months ago. The patient initially had severe left ventricular dysfunction, but her ejection fraction improved. Over the last several months, her heart failure symptoms have gone from class II to class 3a and her ejection fraction has fallen to 25%. Her QRS duration is 170 ms with left bundle branch block pattern. She has been on maximal medical therapy, with beta blockers, diuretics, and ACE inhibitors. She has had no recent ICD shocks. It is unclear to me as to whether she has ever had a defibrillator discharge. She denies fevers or chills. She has minimal if any peripheral edema. Allergies  Allergen Reactions  . Codeine Nausea And Vomiting  . Penicillins Nausea And Vomiting     Current Outpatient Prescriptions  Medication Sig Dispense Refill  . carvedilol (COREG) 25 MG tablet Take 25 mg by mouth 2 (two) times daily with a meal.      . clobetasol ointment (TEMOVATE) 0.05 % Apply topically. As needed      . cyclobenzaprine (FLEXERIL) 10 MG tablet Take 1 tablet (10 mg total) by mouth 2 (two) times daily as needed for muscle spasms.  20 tablet  0  . desonide (DESOWEN) 0.05 % ointment As needed      . DULoxetine (CYMBALTA) 60 MG capsule Take 60 mg by mouth daily.      . fluocinonide ointment (LIDEX) 0.05 % Apply topically. Solution as needed      . furosemide (LASIX) 20 MG tablet Take 20 mg by mouth daily.      Marland Kitchen lisinopril (PRINIVIL,ZESTRIL) 20 MG tablet Take 20 mg by mouth 2 (two) times daily.      . metroNIDAZOLE (FLAGYL) 500 MG tablet Take  As directed if needed      . naproxen sodium (ANAPROX) 220 MG tablet Take 220 mg by mouth 2 (two) times daily as needed. For pain.      Marland Kitchen spironolactone (ALDACTONE) 25 MG  tablet Take 25 mg by mouth daily.      . traMADol (ULTRAM) 50 MG tablet Take 2 tablets (100 mg total) by mouth every 8 (eight) hours as needed for pain.  100 tablet  2   No current facility-administered medications for this visit.     Past Medical History  Diagnosis Date  . Cardiomyopathy   . ICD (implantable cardiac defibrillator) in place   . Fibromyalgia   . CHF (congestive heart failure)   . Heart murmur     ROS:   All systems reviewed and negative except as noted in the HPI.   Past Surgical History  Procedure Laterality Date  . Brain surgery  2004    Tumor  . Cholecystectomy    . Abdominal hysterectomy    . Cardiac catheterization  1983  . Colectomy  2001    sigmoid     History reviewed. No pertinent family history.   History   Social History  . Marital Status: Married    Spouse Name: N/A    Number of Children: N/A  . Years of Education: N/A   Occupational History  . Not on file.   Social History Main Topics  . Smoking status: Never Smoker   . Smokeless tobacco: Not on file  . Alcohol Use: No  .  Drug Use: No  . Sexually Active:    Other Topics Concern  . Not on file   Social History Narrative  . No narrative on file     BP 153/86  Pulse 74  Ht 5\' 5"  (1.651 m)  Wt 272 lb (123.378 kg)  BMI 45.26 kg/m2  Physical Exam:  Well appearing obese, 57 year old woman,NAD HEENT: Unremarkable Neck:  7 cm JVD, no thyromegally Back:  No CVA tenderness Lungs:  Clear with no wheezes, rales, or rhonchi. HEART:  Regular rate rhythm, no murmurs, no rubs, no clicks Abd:  soft, positive bowel sounds, no organomegally, no rebound, no guarding Ext:  2 plus pulses, no edema, no cyanosis, no clubbing Skin:  No rashes no nodules Neuro:  CN II through XII intact, motor grossly intact  EKG - normal sinus rhythm with atrial pacing and left bundle branch block  DEVICE  Normal device function.  See PaceArt for details.   Assess/Plan:

## 2012-09-15 ENCOUNTER — Ambulatory Visit (INDEPENDENT_AMBULATORY_CARE_PROVIDER_SITE_OTHER): Payer: 59 | Admitting: Internal Medicine

## 2012-09-15 ENCOUNTER — Encounter: Payer: Self-pay | Admitting: Internal Medicine

## 2012-09-15 VITALS — BP 132/78 | HR 70 | Temp 98.3°F | Ht 65.0 in | Wt 267.0 lb

## 2012-09-15 DIAGNOSIS — N907 Vulvar cyst: Secondary | ICD-10-CM

## 2012-09-15 DIAGNOSIS — N9089 Other specified noninflammatory disorders of vulva and perineum: Secondary | ICD-10-CM

## 2012-09-15 NOTE — Patient Instructions (Signed)

## 2012-09-15 NOTE — Progress Notes (Signed)
Subjective:    Patient ID: Whitney Jimenez, female    DOB: May 26, 1955, 57 y.o.   MRN: [id number]  HPI  Pt presents to the clinic today with c/o a bump on her left labia. This started 2-3 months ago. She feels like it itches. It does not burn, tingle or drain. She has never had a lesion like this before. She has no new sexual partners. She denies abnormal vaginal discharge, bleeding or pain with intercourse.  Review of Systems      Past Medical History  Diagnosis Date  . Cardiomyopathy   . ICD (implantable cardiac defibrillator) in place   . Fibromyalgia   . CHF (congestive heart failure)   . Heart murmur     Current Outpatient Prescriptions  Medication Sig Dispense Refill  . carvedilol (COREG) 25 MG tablet Take 1 tablet (25 mg total) by mouth 2 (two) times daily with a meal.  60 tablet  5  . clobetasol ointment (TEMOVATE) 0.05 % Apply topically. As needed      . cyclobenzaprine (FLEXERIL) 10 MG tablet Take 1 tablet (10 mg total) by mouth 2 (two) times daily as needed for muscle spasms.  20 tablet  0  . desonide (DESOWEN) 0.05 % ointment As needed      . DULoxetine (CYMBALTA) 60 MG capsule Take 60 mg by mouth daily.      . fluocinonide ointment (LIDEX) 0.05 % Apply topically. Solution as needed      . furosemide (LASIX) 20 MG tablet Take 20 mg by mouth daily.      Marland Kitchen lisinopril (PRINIVIL,ZESTRIL) 20 MG tablet Take 20 mg by mouth 2 (two) times daily.      . metroNIDAZOLE (FLAGYL) 500 MG tablet Take  As directed if needed      . naproxen sodium (ANAPROX) 220 MG tablet Take 220 mg by mouth 2 (two) times daily as needed. For pain.      Marland Kitchen spironolactone (ALDACTONE) 25 MG tablet Take 25 mg by mouth daily.      . traMADol (ULTRAM) 50 MG tablet Take 2 tablets (100 mg total) by mouth every 8 (eight) hours as needed for pain.  100 tablet  2   No current facility-administered medications for this visit.    Allergies  Allergen Reactions  . Codeine Nausea And Vomiting  . Penicillins Nausea  And Vomiting    History reviewed. No pertinent family history.  History   Social History  . Marital Status: Married    Spouse Name: N/A    Number of Children: N/A  . Years of Education: N/A   Occupational History  . Not on file.   Social History Main Topics  . Smoking status: Never Smoker   . Smokeless tobacco: Not on file  . Alcohol Use: No  . Drug Use: No  . Sexually Active: Not on file   Other Topics Concern  . Not on file   Social History Narrative  . No narrative on file     Constitutional: Denies fever, malaise, fatigue, headache or abrupt weight changes.  GU: Denies urgency, frequency, pain with urination, burning sensation, blood in urine, odor or discharge. Skin: Pt reports bump on left labia .Denies redness, rashes, or ulcercations.    No other specific complaints in a complete review of systems (except as listed in HPI above).  Objective:   Physical Exam  BP 132/78  Pulse 70  Temp(Src) 98.3 F (36.8 C) (Oral)  Ht 5\' 5"  (1.651 m)  Wt 267 lb (  121.11 kg)  BMI 44.43 kg/m2  SpO2 97% Wt Readings from Last 3 Encounters:  09/15/12 267 lb (121.11 kg)  08/04/12 272 lb (123.378 kg)  08/03/12 271 lb (122.925 kg)    General: Appears her stated age, well developed, well nourished in NAD. Skin: Warm, dry and intact. No rashes, lesions or ulcerations noted. Cardiovascular: Normal rate and rhythm. S1,S2 noted.  No murmur, rubs or gallops noted. No JVD or BLE edema. No carotid bruits noted. Pulmonary/Chest: Normal effort and positive vesicular breath sounds. No respiratory distress. No wheezes, rales or ronchi noted.  Abdomen: Soft and nontender. Normal bowel sounds, no bruits noted. No distention or masses noted. Liver, spleen and kidneys non palpable. GU: normal female anatomy. Small inclusin cyst noted on left labia.       Assessment & Plan:   Labial cyst, new onset:  No evidence of infection Will refer to gyn to have it cut out  RTC as needed

## 2012-10-12 ENCOUNTER — Encounter: Payer: 59 | Admitting: Obstetrics & Gynecology

## 2012-10-20 ENCOUNTER — Ambulatory Visit (INDEPENDENT_AMBULATORY_CARE_PROVIDER_SITE_OTHER): Payer: 59 | Admitting: Internal Medicine

## 2012-10-20 VITALS — BP 140/90 | HR 57 | Wt 267.0 lb

## 2012-10-20 DIAGNOSIS — I447 Left bundle-branch block, unspecified: Secondary | ICD-10-CM

## 2012-10-20 DIAGNOSIS — I428 Other cardiomyopathies: Secondary | ICD-10-CM

## 2012-10-20 DIAGNOSIS — R0602 Shortness of breath: Secondary | ICD-10-CM

## 2012-10-20 LAB — CBC WITH DIFFERENTIAL/PLATELET
Basophils Absolute: 0 10*3/uL (ref 0.0–0.1)
Basophils Relative: 0.4 % (ref 0.0–3.0)
Eosinophils Absolute: 0.2 10*3/uL (ref 0.0–0.7)
Hemoglobin: 13.2 g/dL (ref 12.0–15.0)
MCHC: 33 g/dL (ref 30.0–36.0)
MCV: 91.8 fl (ref 78.0–100.0)
Monocytes Absolute: 0.6 10*3/uL (ref 0.1–1.0)
Neutro Abs: 6.8 10*3/uL (ref 1.4–7.7)
Neutrophils Relative %: 71 % (ref 43.0–77.0)
RBC: 4.37 Mil/uL (ref 3.87–5.11)
RDW: 13.7 % (ref 11.5–14.6)

## 2012-10-20 LAB — BASIC METABOLIC PANEL
BUN: 17 mg/dL (ref 6–23)
CO2: 28 mEq/L (ref 19–32)
Calcium: 9.4 mg/dL (ref 8.4–10.5)
Chloride: 106 mEq/L (ref 96–112)
Glucose, Bld: 104 mg/dL — ABNORMAL HIGH (ref 70–99)
Potassium: 4.2 mEq/L (ref 3.5–5.1)

## 2012-10-20 LAB — LIPID PANEL: Total CHOL/HDL Ratio: 5

## 2012-10-20 NOTE — Patient Instructions (Addendum)
You will need lab work today:  Cbc,bmp,bnp,lipid panel  Your physician has requested that you have an echocardiogram. Echocardiography is a painless test that uses sound waves to create images of your heart. It provides your doctor with information about the size and shape of your heart and how well your heart's chambers and valves are working. This procedure takes approximately one hour. There are no restrictions for this procedure.  Your physician recommends that you continue on your current medications as directed. Please refer to the Current Medication list given to you today.

## 2012-10-20 NOTE — Progress Notes (Addendum)
HPI Patient is a 57 yo with a history of NICM I saw her for the first time earlier this winter SHe had moved her from [place]  She was diagnosed with CHF in 2000. LVEF was initially in 20s She has had a cardiac cath in 2009 which showd no CAD. Last echo LVEF was 45% on echo in Jan 2013 She has known LBBB (old, present by report in 1082) She has had 3 AICDs. First rplaced due to faulty lead in 2005. Last was put in in Fall 2013. (ERI)  She had never had a CHF exacerbation When I saw her earlier this winter her volume status looked good  After seeing her I recomm an echo This was done and showed an LVEF of 25 to 30%. She has since seen Rosette Reveal to discuss biV pacer/ICD.  The patient is reflecting  Plan for f/u echo first. The patient says her breathing has been rel stable.  Does get SOB at times.  Intermittent edema which is unchanged.    Allergies  Allergen Reactions  . Codeine Nausea And Vomiting  . Penicillins Nausea And Vomiting    Current Outpatient Prescriptions  Medication Sig Dispense Refill  . carvedilol (COREG) 25 MG tablet Take 1 tablet (25 mg total) by mouth 2 (two) times daily with a meal.  60 tablet  5  . clobetasol ointment (TEMOVATE) 0.05 % Apply topically. As needed      . cyclobenzaprine (FLEXERIL) 10 MG tablet Take 1 tablet (10 mg total) by mouth 2 (two) times daily as needed for muscle spasms.  20 tablet  0  . desonide (DESOWEN) 0.05 % ointment As needed      . DULoxetine (CYMBALTA) 60 MG capsule Take 60 mg by mouth daily.      . fluocinonide ointment (LIDEX) 0.05 % Apply topically. Solution as needed      . furosemide (LASIX) 20 MG tablet Take 20 mg by mouth daily.      Marland Kitchen lisinopril (PRINIVIL,ZESTRIL) 20 MG tablet Take 20 mg by mouth 2 (two) times daily.      . metroNIDAZOLE (FLAGYL) 500 MG tablet Take  As directed if needed      . naproxen sodium (ANAPROX) 220 MG tablet Take 220 mg by mouth 2 (two) times daily as needed. For pain.      Marland Kitchen spironolactone (ALDACTONE) 25 MG  tablet Take 25 mg by mouth daily.      . traMADol (ULTRAM) 50 MG tablet Take 2 tablets (100 mg total) by mouth every 8 (eight) hours as needed for pain.  100 tablet  2   No current facility-administered medications for this visit.    Past Medical History  Diagnosis Date  . Cardiomyopathy   . ICD (implantable cardiac defibrillator) in place   . Fibromyalgia   . CHF (congestive heart failure)   . Heart murmur     Past Surgical History  Procedure Laterality Date  . Brain surgery  2004    Tumor  . Cholecystectomy    . Abdominal hysterectomy    . Cardiac catheterization  1983  . Colectomy  2001    sigmoid    No family history on file.  History   Social History  . Marital Status: Married    Spouse Name: N/A    Number of Children: N/A  . Years of Education: N/A   Occupational History  . Not on file.   Social History Main Topics  . Smoking status: Never Smoker   .  Smokeless tobacco: Not on file  . Alcohol Use: No  . Drug Use: No  . Sexually Active: Not on file   Other Topics Concern  . Not on file   Social History Narrative  . No narrative on file    Review of Systems:  All systems reviewed.  They are negative to the above problem except as previously stated.  Vital Signs: BP 140/90  Pulse 57  Wt 267 lb (121.11 kg)  BMI 44.43 kg/m2  Physical Exam Patient is in NAD HEENT:  Normocephalic, atraumatic. EOMI, PERRLA.  Neck: JVP is normal.  No bruits.  Lungs: clear to auscultation. No rales no wheezes.  Heart: Regular rate and rhythm. Normal S1, S2. No S3.   No significant murmurs. PMI not displaced.  Abdomen:  Supple, nontender. Normal bowel sounds. No masses. No hepatomegaly.  Extremities:   Good distal pulses throughout. Tr lower extremity edema.  Musculoskeletal :moving all extremities.  Neuro:   alert and oriented x3.  CN II-XII grossly intact.  EKG  SB 57  LBBB. Assessment and Plan:  1.  NICM  Volume status is pretty good.  Breathing stable. Would  plan echo in fall to reeval LVEF   Keep on same regimen  She plans on getting BP cuff to follow BP at hme  May advance meds  Will check labs. 2.  HTN  As noted above.  3.  HL  Check lipids.

## 2012-10-26 ENCOUNTER — Telehealth: Payer: Self-pay | Admitting: Internal Medicine

## 2012-10-26 NOTE — Telephone Encounter (Signed)
Follow up  ° ° ° ° °Pt is returning your call  °

## 2012-10-26 NOTE — Telephone Encounter (Signed)
Pt aware of lab results. Mylo Red RN

## 2012-11-06 ENCOUNTER — Encounter: Payer: Self-pay | Admitting: Internal Medicine

## 2012-11-06 ENCOUNTER — Ambulatory Visit (HOSPITAL_COMMUNITY): Payer: Private Health Insurance - Indemnity | Attending: Internal Medicine

## 2012-11-06 ENCOUNTER — Ambulatory Visit (INDEPENDENT_AMBULATORY_CARE_PROVIDER_SITE_OTHER): Payer: 59 | Admitting: *Deleted

## 2012-11-06 DIAGNOSIS — I509 Heart failure, unspecified: Secondary | ICD-10-CM | POA: Insufficient documentation

## 2012-11-06 DIAGNOSIS — I447 Left bundle-branch block, unspecified: Secondary | ICD-10-CM | POA: Insufficient documentation

## 2012-11-06 DIAGNOSIS — I5022 Chronic systolic (congestive) heart failure: Secondary | ICD-10-CM

## 2012-11-06 DIAGNOSIS — I359 Nonrheumatic aortic valve disorder, unspecified: Secondary | ICD-10-CM

## 2012-11-06 DIAGNOSIS — IMO0001 Reserved for inherently not codable concepts without codable children: Secondary | ICD-10-CM | POA: Insufficient documentation

## 2012-11-06 DIAGNOSIS — I059 Rheumatic mitral valve disease, unspecified: Secondary | ICD-10-CM | POA: Insufficient documentation

## 2012-11-06 DIAGNOSIS — I428 Other cardiomyopathies: Secondary | ICD-10-CM | POA: Insufficient documentation

## 2012-11-06 LAB — ICD DEVICE OBSERVATION
AL IMPEDENCE ICD: 532 Ohm
AL THRESHOLD: 1 V
DEV-0020ICD: NEGATIVE
RV LEAD AMPLITUDE: 20 mv
TZON-0003FASTVT: 270.2 ms

## 2012-11-06 NOTE — Progress Notes (Signed)
Echocardiogram performed.  

## 2012-11-06 NOTE — Progress Notes (Signed)
ICD check with ICM in office. 

## 2012-11-24 ENCOUNTER — Ambulatory Visit (INDEPENDENT_AMBULATORY_CARE_PROVIDER_SITE_OTHER): Payer: Private Health Insurance - Indemnity | Admitting: Internal Medicine

## 2012-11-24 ENCOUNTER — Encounter: Payer: Self-pay | Admitting: Internal Medicine

## 2012-11-24 VITALS — BP 122/76 | HR 93 | Temp 98.6°F | Wt 268.0 lb

## 2012-11-24 DIAGNOSIS — I428 Other cardiomyopathies: Secondary | ICD-10-CM

## 2012-11-24 DIAGNOSIS — M797 Fibromyalgia: Secondary | ICD-10-CM

## 2012-11-24 DIAGNOSIS — F329 Major depressive disorder, single episode, unspecified: Secondary | ICD-10-CM

## 2012-11-24 DIAGNOSIS — IMO0001 Reserved for inherently not codable concepts without codable children: Secondary | ICD-10-CM

## 2012-11-24 DIAGNOSIS — I420 Dilated cardiomyopathy: Secondary | ICD-10-CM

## 2012-11-24 MED ORDER — FUROSEMIDE 20 MG PO TABS: 20.0000 mg | ORAL_TABLET | Freq: Every day | ORAL | Status: DC

## 2012-11-24 NOTE — Assessment & Plan Note (Signed)
Continue with current prescription therapy as reflected on the Med list.  

## 2012-11-24 NOTE — Progress Notes (Signed)
   Subjective:    HPI    C/o cardiomyopathy /hereditary - ICD since 2001 Dr Tenny Craw; CHF, elev glucose, vit D def She has a h/o brain tumor C/o fibromyalgia sx's  x long time - not well controlled  Wt Readings from Last 3 Encounters:  11/24/12 268 lb (121.564 kg)  10/20/12 267 lb (121.11 kg)  09/15/12 267 lb (121.11 kg)   BP Readings from Last 3 Encounters:  11/24/12 122/76  10/20/12 140/90  09/15/12 132/78      Review of Systems  Constitutional: Negative for chills, activity change, appetite change, fatigue and unexpected weight change.  HENT: Negative for congestion, mouth sores and sinus pressure.   Eyes: Negative for visual disturbance.  Respiratory: Negative for cough and chest tightness.   Gastrointestinal: Negative for nausea and abdominal pain.  Genitourinary: Negative for frequency, difficulty urinating and vaginal pain.  Musculoskeletal: Negative for back pain and gait problem.  Skin: Negative for pallor and rash.  Neurological: Negative for dizziness, tremors, weakness, numbness and headaches.  Psychiatric/Behavioral: Negative for confusion and sleep disturbance.       Objective:   Physical Exam  Constitutional: She appears well-developed. No distress.  obese  HENT:  Head: Normocephalic.  Right Ear: External ear normal.  Left Ear: External ear normal.  Nose: Nose normal.  Mouth/Throat: Oropharynx is clear and moist.  Eyes: Conjunctivae are normal. Pupils are equal, round, and reactive to light. Right eye exhibits no discharge. Left eye exhibits no discharge.  Neck: Normal range of motion. Neck supple. No JVD present. No tracheal deviation present. No thyromegaly present.  Cardiovascular: Normal rate, regular rhythm and normal heart sounds.   Pulmonary/Chest: No stridor. No respiratory distress. She has no wheezes.  Abdominal: Soft. Bowel sounds are normal. She exhibits no distension and no mass. There is no tenderness. There is no rebound and no guarding.   Musculoskeletal: She exhibits no edema and no tenderness.  Lymphadenopathy:    She has no cervical adenopathy.  Neurological: She displays normal reflexes. No cranial nerve deficit. She exhibits normal muscle tone. Coordination normal.  Skin: No rash noted. No erythema.  Psychiatric: She has a normal mood and affect. Her behavior is normal. Judgment and thought content normal.    Lab Results  Component Value Date   WBC 9.5 10/20/2012   HGB 13.2 10/20/2012   HCT 40.1 10/20/2012   PLT 250.0 10/20/2012   GLUCOSE 104* 10/20/2012   CHOL 165 10/20/2012   TRIG 127.0 10/20/2012   HDL 35.80* 10/20/2012   LDLCALC 104* 10/20/2012   ALT 21 04/24/2012   AST 21 04/24/2012   NA 140 10/20/2012   K 4.2 10/20/2012   CL 106 10/20/2012   CREATININE 0.9 10/20/2012   BUN 17 10/20/2012   CO2 28 10/20/2012   TSH 0.95 04/24/2012   HGBA1C 6.2 05/01/2012    Chart reviewed      Assessment & Plan:

## 2012-12-19 ENCOUNTER — Ambulatory Visit (INDEPENDENT_AMBULATORY_CARE_PROVIDER_SITE_OTHER): Payer: Private Health Insurance - Indemnity | Admitting: Internal Medicine

## 2012-12-19 ENCOUNTER — Encounter: Payer: Self-pay | Admitting: Internal Medicine

## 2012-12-19 VITALS — BP 154/78 | HR 65 | Ht 65.0 in | Wt 265.0 lb

## 2012-12-19 DIAGNOSIS — I428 Other cardiomyopathies: Secondary | ICD-10-CM

## 2012-12-19 DIAGNOSIS — Z95810 Presence of automatic (implantable) cardiac defibrillator: Secondary | ICD-10-CM

## 2012-12-19 DIAGNOSIS — I5022 Chronic systolic (congestive) heart failure: Secondary | ICD-10-CM

## 2012-12-19 DIAGNOSIS — I420 Dilated cardiomyopathy: Secondary | ICD-10-CM

## 2012-12-19 NOTE — Assessment & Plan Note (Addendum)
Her symptoms are class II. Her weight is up a few pounds, and her fluid index is slightly elevated. I've asked her to take an additional Lasix until her weight goes down 2- 3 pounds.

## 2012-12-19 NOTE — Progress Notes (Signed)
HPI Mrs. Buscemi returns today for ICD followup. She is a very pleasant morbidly obese middle-age woman with a nonischemic cardiomyopathy, chronic class II systolic heart failure, left bundle branch block, status post ICD implantation. Overall, the patient has been stable. She has had no worsening heart failure symptoms and denies chest pain or shortness of breath. She has been unable to lose weight, remaining morbidly obese. She denies syncope. She is suffering him back to [place] and seen her cardiologist in [place]. At the current time we both think avoiding additional surgery to upgrade her ICD to a biventricular device is most appropriate. Allergies  Allergen Reactions  . Codeine Nausea And Vomiting  . Penicillins Nausea And Vomiting     Current Outpatient Prescriptions  Medication Sig Dispense Refill  . carvedilol (COREG) 25 MG tablet Take 25 mg by mouth 2 (two) times daily with a meal. 1 po in the am (25mg ) and 1 1/2 in the pm (37.5mg )      . clobetasol ointment (TEMOVATE) 0.05 % Apply topically. As needed      . cyclobenzaprine (FLEXERIL) 10 MG tablet Take 1 tablet (10 mg total) by mouth 2 (two) times daily as needed for muscle spasms.  20 tablet  0  . desonide (DESOWEN) 0.05 % ointment As needed      . DULoxetine (CYMBALTA) 60 MG capsule Take 60 mg by mouth daily.      . fluocinonide ointment (LIDEX) 0.05 % Apply topically. Solution as needed      . furosemide (LASIX) 20 MG tablet Take 1-2 tablets (20-40 mg total) by mouth daily.  60 tablet  11  . lisinopril (PRINIVIL,ZESTRIL) 20 MG tablet Take 20 mg by mouth 2 (two) times daily.      . metroNIDAZOLE (FLAGYL) 500 MG tablet Take  As directed if needed      . naproxen sodium (ANAPROX) 220 MG tablet Take 220 mg by mouth 2 (two) times daily as needed. For pain.      Marland Kitchen spironolactone (ALDACTONE) 25 MG tablet Take 25 mg by mouth daily.      . traMADol (ULTRAM) 50 MG tablet Take 2 tablets (100 mg total) by mouth every 8 (eight) hours as  needed for pain.  100 tablet  2   No current facility-administered medications for this visit.     Past Medical History  Diagnosis Date  . Cardiomyopathy   . ICD (implantable cardiac defibrillator) in place   . Fibromyalgia   . CHF (congestive heart failure)   . Heart murmur     ROS:   All systems reviewed and negative except as noted in the HPI.   Past Surgical History  Procedure Laterality Date  . Brain surgery  2004    Tumor  . Cholecystectomy    . Abdominal hysterectomy    . Cardiac catheterization  1983  . Colectomy  2001    sigmoid     History reviewed. No pertinent family history.   History   Social History  . Marital Status: Married    Spouse Name: N/A    Number of Children: N/A  . Years of Education: N/A   Occupational History  . Not on file.   Social History Main Topics  . Smoking status: Never Smoker   . Smokeless tobacco: Not on file  . Alcohol Use: No  . Drug Use: No  . Sexual Activity: Not on file   Other Topics Concern  . Not on file   Social History Narrative  .  No narrative on file     BP 154/78  Pulse 65  Ht 5\' 5"  (1.651 m)  Wt 265 lb (120.203 kg)  BMI 44.1 kg/m2  Physical Exam:  Well appearing morbidly obese, middle-age woman,NAD HEENT: Unremarkable Neck:  6 cm JVD, no thyromegally Back:  No CVA tenderness Lungs:  Clear with no wheezes, rales, or rhonchi. HEART:  Regular rate rhythm, no murmurs, no rubs, no clicks, heart sounds are distant. Abd:  soft, obese, positive bowel sounds, no organomegally, no rebound, no guarding Ext:  2 plus pulses, no edema, no cyanosis, no clubbing Skin:  No rashes no nodules Neuro:  CN II through XII intact, motor grossly intact  EKG - normal sinus rhythm with left bundle branch block  DEVICE  Normal device function.  See PaceArt for details.   Assess/Plan:

## 2012-12-19 NOTE — Patient Instructions (Addendum)
Your physician wants you to follow-up in: 12 months with Dr Court Joy will receive a reminder letter in the mail two months in advance. If you don't receive a letter, please call our office to schedule the follow-up appointment.  Remote monitoring is used to monitor your Pacemaker of ICD from home. This monitoring reduces the number of office visits required to check your device to one time per year. It allows [place] to keep an eye on the functioning of your device to ensure it is working properly. You are scheduled for a device check from home on 03/19/13. You may send your transmission at any time that day. If you have a wireless device, the transmission will be sent automatically. After your physician reviews your transmission, you will receive a postcard with your next transmission date.

## 2012-12-19 NOTE — Assessment & Plan Note (Signed)
Her Medtronic dual-chamber ICD is working normally. We'll plan to recheck in several months. 

## 2013-01-01 ENCOUNTER — Emergency Department (HOSPITAL_COMMUNITY)
Admission: EM | Admit: 2013-01-01 | Discharge: 2013-01-01 | Disposition: A | Discharge status: Home / Self Care | Payer: Private Health Insurance - Indemnity | Attending: Emergency Medicine | Admitting: Emergency Medicine

## 2013-01-01 ENCOUNTER — Emergency Department (HOSPITAL_COMMUNITY): Payer: Private Health Insurance - Indemnity

## 2013-01-01 ENCOUNTER — Encounter (HOSPITAL_COMMUNITY): Payer: Self-pay | Admitting: Emergency Medicine

## 2013-01-01 DIAGNOSIS — E669 Obesity, unspecified: Secondary | ICD-10-CM | POA: Insufficient documentation

## 2013-01-01 DIAGNOSIS — K529 Noninfective gastroenteritis and colitis, unspecified: Secondary | ICD-10-CM

## 2013-01-01 DIAGNOSIS — Z880 Allergy status to penicillin: Secondary | ICD-10-CM | POA: Insufficient documentation

## 2013-01-01 DIAGNOSIS — K589 Irritable bowel syndrome without diarrhea: Secondary | ICD-10-CM | POA: Insufficient documentation

## 2013-01-01 DIAGNOSIS — Z79899 Other long term (current) drug therapy: Secondary | ICD-10-CM | POA: Insufficient documentation

## 2013-01-01 DIAGNOSIS — I428 Other cardiomyopathies: Secondary | ICD-10-CM | POA: Insufficient documentation

## 2013-01-01 DIAGNOSIS — K5730 Diverticulosis of large intestine without perforation or abscess without bleeding: Secondary | ICD-10-CM | POA: Insufficient documentation

## 2013-01-01 DIAGNOSIS — I509 Heart failure, unspecified: Secondary | ICD-10-CM | POA: Insufficient documentation

## 2013-01-01 DIAGNOSIS — I10 Essential (primary) hypertension: Secondary | ICD-10-CM | POA: Insufficient documentation

## 2013-01-01 DIAGNOSIS — Z95810 Presence of automatic (implantable) cardiac defibrillator: Secondary | ICD-10-CM | POA: Insufficient documentation

## 2013-01-01 DIAGNOSIS — IMO0001 Reserved for inherently not codable concepts without codable children: Secondary | ICD-10-CM | POA: Insufficient documentation

## 2013-01-01 DIAGNOSIS — K5289 Other specified noninfective gastroenteritis and colitis: Secondary | ICD-10-CM | POA: Insufficient documentation

## 2013-01-01 HISTORY — DX: Essential (primary) hypertension: I10

## 2013-01-01 LAB — URINALYSIS, ROUTINE W REFLEX MICROSCOPIC
Bilirubin Urine: NEGATIVE
Glucose, UA: NEGATIVE mg/dL
Hgb urine dipstick: NEGATIVE
Ketones, ur: NEGATIVE mg/dL
Protein, ur: NEGATIVE mg/dL
pH: 5.5 (ref 5.0–8.0)

## 2013-01-01 LAB — CBC WITH DIFFERENTIAL/PLATELET
Basophils Absolute: 0.1 10*3/uL (ref 0.0–0.1)
Basophils Relative: 0 % (ref 0–1)
Eosinophils Absolute: 0.2 10*3/uL (ref 0.0–0.7)
Eosinophils Relative: 1 % (ref 0–5)
HCT: 46.7 % — ABNORMAL HIGH (ref 36.0–46.0)
Lymphs Abs: 2.8 10*3/uL (ref 0.7–4.0)
MCH: 30.4 pg (ref 26.0–34.0)
MCHC: 33.8 g/dL (ref 30.0–36.0)
Monocytes Absolute: 1 10*3/uL (ref 0.1–1.0)
Neutro Abs: 14 10*3/uL — ABNORMAL HIGH (ref 1.7–7.7)
Neutrophils Relative %: 77 % (ref 43–77)
RDW: 13.3 % (ref 11.5–15.5)
WBC: 18.1 10*3/uL — ABNORMAL HIGH (ref 4.0–10.5)

## 2013-01-01 LAB — HEPATIC FUNCTION PANEL
ALT: 10 U/L (ref 0–35)
AST: 15 U/L (ref 0–37)
Albumin: 3.5 g/dL (ref 3.5–5.2)
Alkaline Phosphatase: 39 U/L (ref 39–117)
Bilirubin, Direct: <0.1 mg/dL (ref 0.0–0.3)
Total Bilirubin: 0.2 mg/dL — ABNORMAL LOW (ref 0.3–1.2)

## 2013-01-01 LAB — BASIC METABOLIC PANEL
BUN: 26 mg/dL — ABNORMAL HIGH (ref 6–23)
CO2: 26 mEq/L (ref 19–32)
Calcium: 9.4 mg/dL (ref 8.4–10.5)
Chloride: 98 mEq/L (ref 96–112)
GFR calc Af Amer: 73 mL/min — ABNORMAL LOW (ref >90)
Glucose, Bld: 164 mg/dL — ABNORMAL HIGH (ref 70–99)

## 2013-01-01 LAB — POCT I-STAT TROPONIN I: Troponin i, poc: 0 ng/mL (ref 0.00–0.08)

## 2013-01-01 MED ORDER — OXYCODONE-ACETAMINOPHEN 5-325 MG PO TABS: ORAL_TABLET | ORAL | Status: DC

## 2013-01-01 MED ORDER — IOHEXOL 300 MG/ML  SOLN
100.0000 mL | Freq: Once | INTRAMUSCULAR | Status: AC | PRN
  Administered 2013-01-01: 100 mL via INTRAVENOUS

## 2013-01-01 MED ORDER — IOHEXOL 300 MG/ML  SOLN
50.0000 mL | Freq: Once | INTRAMUSCULAR | Status: AC | PRN
  Administered 2013-01-01: 50 mL via ORAL

## 2013-01-01 MED ORDER — METRONIDAZOLE 500 MG PO TABS: 500.0000 mg | ORAL_TABLET | Freq: Three times a day (TID) | ORAL | Status: DC

## 2013-01-01 MED ORDER — CIPROFLOXACIN HCL 500 MG PO TABS: 500.0000 mg | ORAL_TABLET | Freq: Two times a day (BID) | ORAL | Status: DC

## 2013-01-01 NOTE — ED Notes (Signed)
Pt c/o of abd pain that started last night. Hx diverticulitis. Denies n/v/d.

## 2013-01-01 NOTE — ED Notes (Signed)
Patient transported to CT 

## 2013-01-01 NOTE — ED Provider Notes (Signed)
CSN: [id number]     Arrival date & time 01/01/13  0820 History   First MD Initiated Contact with Patient 01/01/13 0831     No chief complaint on file.  (Consider location/radiation/quality/duration/timing/severity/associated sxs/prior Treatment) HPI  Whitney Jimenez is a 57 y.o. female with PMH  significant for CHF, cardiomyopathy, ICD placement, recurrent diverticulitis (status post sigmoid colectomy) complaining of postprandial epigastric pain over the course of 2 weeks. Patient rates the pain as severe, no alleviating factors identified, patient recently moved to the area and does not have a gastroenterologist. Patient denies chest pain, shortness of breath, increasing peripheral edema, nausea vomiting, diarrhea, urinary symptoms, melena or hematochezia.  Primary care physician Plot[facility]  Past Medical History  Diagnosis Date  . Cardiomyopathy   . ICD (implantable cardiac defibrillator) in place   . Fibromyalgia   . CHF (congestive heart failure)   . Heart murmur    Past Surgical History  Procedure Laterality Date  . Brain surgery  2004    Tumor  . Cholecystectomy    . Abdominal hysterectomy    . Cardiac catheterization  1983  . Colectomy  2001    sigmoid   No family history on file. History  Substance Use Topics  . Smoking status: Never Smoker   . Smokeless tobacco: Not on file  . Alcohol Use: No   OB History   Grav Para Term Preterm Abortions TAB SAB Ect Mult Living                 Review of Systems 10 systems reviewed and found to be negative, except as noted in the HPI  Allergies  Codeine and Penicillins  Home Medications   Current Outpatient Rx  Name  Route  Sig  Dispense  Refill  . carvedilol (COREG) 25 MG tablet   Oral   Take 25 mg by mouth 2 (two) times daily with a meal. 1 po in the am (25mg ) and 1 1/2 in the pm (37.5mg )         . clobetasol ointment (TEMOVATE) 0.05 %   Topical   Apply topically. As needed         . cyclobenzaprine (FLEXERIL) 10  MG tablet   Oral   Take 1 tablet (10 mg total) by mouth 2 (two) times daily as needed for muscle spasms.   20 tablet   0   . desonide (DESOWEN) 0.05 % ointment      As needed         . DULoxetine (CYMBALTA) 60 MG capsule   Oral   Take 60 mg by mouth daily.         . fluocinonide ointment (LIDEX) 0.05 %   Topical   Apply topically. Solution as needed         . furosemide (LASIX) 20 MG tablet   Oral   Take 1-2 tablets (20-40 mg total) by mouth daily.   60 tablet   11   . lisinopril (PRINIVIL,ZESTRIL) 20 MG tablet   Oral   Take 20 mg by mouth 2 (two) times daily.         . metroNIDAZOLE (FLAGYL) 500 MG tablet      Take  As directed if needed         . naproxen sodium (ANAPROX) 220 MG tablet   Oral   Take 220 mg by mouth 2 (two) times daily as needed. For pain.         Marland Kitchen spironolactone (ALDACTONE) 25 MG tablet  Oral   Take 25 mg by mouth daily.         . traMADol (ULTRAM) 50 MG tablet   Oral   Take 2 tablets (100 mg total) by mouth every 8 (eight) hours as needed for pain.   100 tablet   2    BP 145/62  Pulse 80  Temp(Src) 98.6 F (37 C) (Oral)  Resp 16  SpO2 95% Physical Exam  Nursing note and vitals reviewed. Constitutional: She is oriented to person, place, and time. She appears well-developed and well-nourished. No distress.  Obese  HENT:  Head: Normocephalic.  Eyes: Conjunctivae and EOM are normal.  Cardiovascular: Normal rate.   Pulmonary/Chest: Effort normal and breath sounds normal. No stridor. No respiratory distress. She has no wheezes. She has no rales. She exhibits no tenderness.  Abdominal: Soft. Bowel sounds are normal. She exhibits no distension and no mass. There is tenderness. There is no rebound and no guarding.  Palpation epigastrium and left lower quadrant, no guarding or rebound.  Musculoskeletal: Normal range of motion.  Neurological: She is alert and oriented to person, place, and time.  Psychiatric: She has a normal  mood and affect.    ED Course  Procedures (including critical care time) Labs Review Labs Reviewed  CBC WITH DIFFERENTIAL - Abnormal; Notable for the following:    WBC 18.1 (*)    RBC 5.20 (*)    Hemoglobin 15.8 (*)    HCT 46.7 (*)    Neutro Abs 14.0 (*)    All other components within normal limits  BASIC METABOLIC PANEL - Abnormal; Notable for the following:    Glucose, Bld 164 (*)    BUN 26 (*)    GFR calc non Af Amer 63 (*)    GFR calc Af Amer 73 (*)    All other components within normal limits  HEPATIC FUNCTION PANEL - Abnormal; Notable for the following:    Total Bilirubin 0.2 (*)    All other components within normal limits  URINALYSIS, ROUTINE W REFLEX MICROSCOPIC  POCT I-STAT TROPONIN I   Imaging Review No results found.   Date: 01/01/2013  Rate: 68  Rhythm: normal sinus rhythm  QRS Axis: left  Intervals: normal  ST/T Wave abnormalities: nonspecific ST/T changes  Conduction Disutrbances:left bundle branch block  Narrative Interpretation: appropriate discordance  based on sgarbossa criteria  Old EKG Reviewed: unchanged    MDM   1. Enterocolitis      Filed Vitals:   01/01/13 0842 01/01/13 0945  BP: 145/62 117/61  Pulse: 80 69  Temp: 98.6 F (37 C)   TempSrc: Oral   Resp: 16 15  SpO2: 95% 99%     Whitney Jimenez is a 57 y.o. female with epigastric and left lower quadrant abdominal pain. Patient has a significant leukocytosis of 18.1. Serial abdominal exams remained benign. EKG is nonischemic and troponin is negative. CT shows numerous inflamed loops of distal small bowel and mesenteric stranding. No signs of abscess or bowel obstruction. Patient is very comfortable, nonseptic appearing, really minimally tender on abdominal exam. I will start her on Cipro and Flagyl and control the pain with Percocet. We've had an extensive discussion of return precautions. I think it is reasonable to refer her to a gastroenterologist and she will need continued followup  for her diverticulitis and she is also due for a colonoscopy at this time.Discussed case with attending who agrees with plan and stability to d/c to home.   Medications  iohexol (OMNIPAQUE)  300 MG/ML solution 50 mL (50 mLs Oral Contrast Given 01/01/13 0945)  iohexol (OMNIPAQUE) 300 MG/ML solution 100 mL (100 mLs Intravenous Contrast Given 01/01/13 1019)    Pt is hemodynamically stable, appropriate for, and amenable to discharge at this time. Pt verbalized understanding and agrees with care plan. All questions answered. Outpatient follow-up and specific return precautions discussed.    Discharge Medication List as of 01/01/2013 12:04 PM    START taking these medications   Details  ciprofloxacin (CIPRO) 500 MG tablet Take 1 tablet (500 mg total) by mouth every 12 (twelve) hours., Starting 01/01/2013, Until Discontinued, Print    !! metroNIDAZOLE (FLAGYL) 500 MG tablet Take 1 tablet (500 mg total) by mouth 3 (three) times daily. One tab PO bid x 10 days, Starting 01/01/2013, Until Discontinued, Print    oxyCODONE-acetaminophen (PERCOCET/ROXICET) 5-325 MG per tablet 1 to 2 tabs PO q6hrs  PRN for pain, Print     !! - Potential duplicate medications found. Please discuss with provider.      Note: Portions of this report may have been transcribed using voice recognition software. Every effort was made to ensure accuracy; however, inadvertent computerized transcription errors may be present      Wynetta Emery, PA-C 01/01/13 1610

## 2013-01-02 NOTE — ED Provider Notes (Signed)
Medical screening examination/treatment/procedure(s) were performed by non-physician practitioner and as supervising physician I was immediately available for consultation/collaboration.  Jaymar Loeber T Ledora Delker, MD 01/02/13 0710 

## 2013-01-03 ENCOUNTER — Telehealth: Payer: Self-pay | Admitting: Internal Medicine

## 2013-01-03 DIAGNOSIS — K5289 Other specified noninfective gastroenteritis and colitis: Secondary | ICD-10-CM

## 2013-01-03 NOTE — Telephone Encounter (Signed)
Needs referral to Dr. Marina Goodell.  Was in the ER yesterday.

## 2013-01-04 ENCOUNTER — Telehealth: Payer: Self-pay | Admitting: Internal Medicine

## 2013-01-04 ENCOUNTER — Encounter: Payer: Self-pay | Admitting: Internal Medicine

## 2013-01-04 NOTE — Telephone Encounter (Signed)
Patient is scheduled to see  Mike Gip PA tomorrow at 8:30

## 2013-01-04 NOTE — Telephone Encounter (Signed)
LMOM to let the pt know someone will call with an appt.

## 2013-01-04 NOTE — Telephone Encounter (Signed)
Left a message for Whitney Jimenez to call back

## 2013-01-05 ENCOUNTER — Telehealth: Payer: Self-pay | Admitting: *Deleted

## 2013-01-05 ENCOUNTER — Ambulatory Visit (INDEPENDENT_AMBULATORY_CARE_PROVIDER_SITE_OTHER): Payer: Private Health Insurance - Indemnity | Admitting: Physician Assistant

## 2013-01-05 ENCOUNTER — Other Ambulatory Visit (INDEPENDENT_AMBULATORY_CARE_PROVIDER_SITE_OTHER): Payer: Private Health Insurance - Indemnity

## 2013-01-05 ENCOUNTER — Encounter: Payer: Self-pay | Admitting: Physician Assistant

## 2013-01-05 ENCOUNTER — Encounter: Payer: Self-pay | Admitting: *Deleted

## 2013-01-05 VITALS — BP 120/68 | HR 64 | Ht 64.0 in | Wt 268.1 lb

## 2013-01-05 DIAGNOSIS — K5190 Ulcerative colitis, unspecified, without complications: Secondary | ICD-10-CM

## 2013-01-05 DIAGNOSIS — K5730 Diverticulosis of large intestine without perforation or abscess without bleeding: Secondary | ICD-10-CM

## 2013-01-05 DIAGNOSIS — K5790 Diverticulosis of intestine, part unspecified, without perforation or abscess without bleeding: Secondary | ICD-10-CM

## 2013-01-05 DIAGNOSIS — R109 Unspecified abdominal pain: Secondary | ICD-10-CM

## 2013-01-05 LAB — CBC WITH DIFFERENTIAL/PLATELET
Basophils Relative: 0.3 % (ref 0.0–3.0)
Eosinophils Absolute: 0.2 10*3/uL (ref 0.0–0.7)
Hemoglobin: 13.6 g/dL (ref 12.0–15.0)
Lymphocytes Relative: 22.9 % (ref 12.0–46.0)
MCHC: 33.8 g/dL (ref 30.0–36.0)
MCV: 88.8 fl (ref 78.0–100.0)
Monocytes Absolute: 0.6 10*3/uL (ref 0.1–1.0)
Monocytes Relative: 6.5 % (ref 3.0–12.0)
Neutro Abs: 6.5 10*3/uL (ref 1.4–7.7)
RBC: 4.51 Mil/uL (ref 3.87–5.11)

## 2013-01-05 MED ORDER — TRAMADOL HCL 50 MG PO TABS: 50.0000 mg | ORAL_TABLET | Freq: Four times a day (QID) | ORAL | Status: DC | PRN

## 2013-01-05 MED ORDER — MAGIC MOUTHWASH W/LIDOCAINE: ORAL | Status: DC

## 2013-01-05 MED ORDER — METHYLPREDNISOLONE (PAK) 4 MG PO TABS: ORAL_TABLET | ORAL | Status: DC

## 2013-01-05 NOTE — Patient Instructions (Addendum)
Please go to the basement level to have your labs drawn.  Sent a prescription for Ultram 50 mg to [address]  We made you an appointment to follow up with Dr. Erick Blinks on 02-01-2013 @ 9:00 am. We will make you an appointment with Dr. Dietrich Pates and call you with that appointment.  Please get your records from your Doctor in [place]. Please either have them faxed to [facility].

## 2013-01-05 NOTE — Telephone Encounter (Signed)
I called the home number which went to her voice mail. I left her a message that I made her an appointment with Dr. Dietrich Pates for Friday 02-16-2013 at 1:45 PM . ( Left her the message with  their number which is [phone]. )

## 2013-01-05 NOTE — Progress Notes (Addendum)
Subjective:    Patient ID: Whitney Jimenez, female    DOB: 12/27/1955, 57 y.o.   MRN: [id number]  HPI [name] is a pleasant 57 year old white female new to GI today. She comes here after an emergency room visit on 01/01/2013 for acute abdominal pain. She is relatively new to [facility] having moved from [place]. 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 She is followed by Dr. Ladona Ridgel and Dr. Tenny Craw. History of congestive heart failure fibromyalgia left bundle branch block and has history of recurrent diverticulitis with a sigmoid resection done she believes in 2001 she is also status post cholecystectomy. She states her last colonoscopy was probably in 2005. She has recurrent episodes over the past 10 years with abrupt onset of "becoming violently ill and "with nausea and vomiting and diarrhea as well as abdominal pain. She says she was hospitalized for the last episode she hadn't 2005 and was told that she had "twisted intestines". She had an NG tube but did not require surgery. She says she really hadn't had any episodes since that time until earlier this week on Monday when she developed abrupt onset of severe abdominal pain which she says was worse in labor pains this was associated with some sweating dear [place] cramping but no nausea vomiting or diarrhea. Since then she's had some intermittent nausea. She says she feels better over the past couple of days but is still uncomfortable with eating. She's not had any associated fever or chills and has not developed any diarrhea or any bleeding. Workup in the emergency room showed WBC of 18.1 hemoglobin 15.8 hematocrit 46.7. She was placed empirically on a course of Cipro and Flagyl. She had CT scan of the abdomen and pelvis done which showed numerous inflamed loops of distal small bowel with mesenteric stranding and free fluid the right colon to the hepatic flexure also appears  affected but the tip of the cecum and appendix are spared felt to be consistent with an acute nonspecific enterocolitis was diverticulosis noted but no evidence of diverticulitis and a major arterial structures in the abdomen and pelvis appeared to be patent    Review of Systems  Constitutional: Positive for activity change and appetite change.  HENT: Negative.   Eyes: Negative.   Respiratory: Negative.   Cardiovascular: Negative.   Gastrointestinal: Positive for nausea and abdominal pain.  Endocrine: Negative.   Genitourinary: Negative.   Musculoskeletal: Positive for myalgias.  Skin: Negative.   Allergic/Immunologic: Negative.   Neurological: Negative.   Hematological: Negative.   Psychiatric/Behavioral: Negative.    Outpatient Prescriptions Prior to Visit  Medication Sig Dispense Refill  . carvedilol (COREG) 25 MG tablet Take 25-37.5 mg by mouth 2 (two) times daily with a meal. 1 po in the am (25mg ) and 1 1/2 in the pm (37.5mg )      . ciprofloxacin (CIPRO) 500 MG tablet Take 1 tablet (500 mg total) by mouth every 12 (twelve) hours.  20 tablet  0  . clobetasol ointment (TEMOVATE) 0.05 % Apply 1 application topically 2 (two) times daily as needed (rash). As needed      . desonide (DESOWEN) 0.05 % ointment Apply 1 application topically 2 (two) times daily as needed (rash). As needed      . DULoxetine (CYMBALTA) 60 MG capsule Take 60 mg by mouth daily.      . fluocinonide ointment (LIDEX) 0.05 % Apply 1 application topically 2 (two) times  daily as needed (rash). Solution as needed      . furosemide (LASIX) 20 MG tablet Take 20-40 mg by mouth daily.      . Ibuprofen-Diphenhydramine Cit (IBUPROFEN PM) 200-38 MG TABS Take 2 tablets by mouth at bedtime as needed (sleep and pain).      Marland Kitchen lisinopril (PRINIVIL,ZESTRIL) 20 MG tablet Take 20 mg by mouth 2 (two) times daily.      . metroNIDAZOLE (FLAGYL) 500 MG tablet Take 1 tablet (500 mg total) by mouth 3 (three) times daily. One tab PO bid x  10 days  30 tablet  0  . naproxen sodium (ANAPROX) 220 MG tablet Take 220 mg by mouth 2 (two) times daily as needed. For pain.      Marland Kitchen spironolactone (ALDACTONE) 25 MG tablet Take 25 mg by mouth daily.      . metroNIDAZOLE (FLAGYL) 500 MG tablet Take 500 mg by mouth 3 (three) times daily as needed (pain). Take  As directed if needed      . oxyCODONE-acetaminophen (PERCOCET/ROXICET) 5-325 MG per tablet 1 to 2 tabs PO q6hrs  PRN for pain  15 tablet  0   No facility-administered medications prior to visit.   Allergies  Allergen Reactions  . Codeine Nausea And Vomiting  . Morphine And Related Nausea And Vomiting  . Penicillins Rash   Patient Active Problem List   Diagnosis Date Noted  . Ulcerative colitis, unspecified 01/05/2013  . Chronic systolic heart failure 08/05/2012  . Left bundle branch block 08/05/2012  . Vitamin D deficiency 04/25/2012  . Fibromyalgia syndrome 04/24/2012  . Psoriasis 04/24/2012  . Meningioma 04/24/2012  . Diverticulosis 04/24/2012  . Migraine 04/24/2012  . Depression 04/24/2012  . Dilated cardiomyopathy 03/22/2012  . Automatic implantable cardioverter-defibrillator in situ 03/22/2012   History  Substance Use Topics  . Smoking status: Never Smoker   . Smokeless tobacco: Never Used  . Alcohol Use: Yes     Comment: social- wine with dinner   family history includes Diabetes in her maternal grandmother and mother; Heart disease in her father, maternal grandmother, mother, paternal grandfather, and paternal grandmother; Lung cancer in her paternal grandmother.     Objective:   Physical Exam   Well-developed white female in no acute distress, pleasant blood pressure 120/68 pulse 64 height 5 foot 4 weight 268. HEENT; nontraumatic normocephalic EOMI PERRLA sclera anicteric, Supple; no JVD, Cardiovascular; regular rate and rhythm with S1-S2 she does have a systolic murmur,ICD in the left chest wall  Pulmonary; clear bilaterally,, Abdomen; large soft bowel sounds  are present no audible bruit she is tender in the right lower quadrant right mid quadrant and across the upper abdomen is no guarding or rebound no palpable mass or hepatosplenomegaly, Rectal; exam not done, Extremities; no clubbing cyanosis or edema skin warm and dry, Psych; mood and affect normal and appropriate       Assessment & Plan:  #40  57 year old female with an episode of acute severe of Donald pain onset 4 days ago associated with serious severe cramping and some diaphoresis but no nausea vomiting diarrhea bleeding or fever. She did have a significant leukocytosis and CT scan showed inflammation of multiple loops of distal small bowel as well as the right colon to the hepatic flexure. This was labeled as a nonspecific enterocolitis by CT I doubt this was infectious says she had no fever or diarrhea or vomiting Mesenteric vessels appear patent on CT scan making an ischemic event unlikely I do wonder  if she has had an episode of ACE induced angioedema and she has been on lisinopril for some time. Her previous episodes of abdominal pain with nausea and vomiting and diarrhea are of uncertain etiology and do not have records available to this time to review. #2 dilated cardiomyopathy EF 30-35% status post ICD placement #3 chronic systolic congestive heart failure #4 left bundle branch block #5 fibromyalgia #6 status post cholecystectomy and sigmoid colon rectum he for diverticulitis  Plan; long discussion with patient and her husband-will not stop lisinopril at this time as she does have significant cardiac disease and will defer that to her cardiologist. Would suggest stopping the ACE inhibitor and using another agent. We have made her a followup appointment with Dr. Tenny Craw to discuss Treatment at this point is supportive and she is improving We'll start Ultram 50 mg every 6 hours as needed for pain Repeat CBC today and if improved have will ask her to stop her antibiotics on Monday  Check  C1 esterase level followupy with Dr. Rhea Belton in 2-3 weeks.. Have asked the patient to obtain her old records including her last colonoscopy so we can decide if any any endoscopic intervention is indicated  Addendum: Reviewed and agree with initial management as well as close office follow-up. Beverley Fiedler, MD

## 2013-01-10 ENCOUNTER — Other Ambulatory Visit: Payer: Self-pay | Admitting: Internal Medicine

## 2013-01-12 ENCOUNTER — Encounter: Payer: Self-pay | Admitting: Internal Medicine

## 2013-01-15 ENCOUNTER — Other Ambulatory Visit: Payer: Self-pay | Admitting: *Deleted

## 2013-01-15 MED ORDER — CARVEDILOL 25 MG PO TABS: 25.0000 mg | ORAL_TABLET | Freq: Two times a day (BID) | ORAL | Status: DC

## 2013-01-15 NOTE — Telephone Encounter (Signed)
Whitney Jimenez, I am not sure how patient is to be taking this. I have been trying to reach her by phone but have been unsuccessful. Today, we received a paper request and I noticed that she has a [place] address. Looking at her last OV Dr. Ladona Ridgel acknowledged that she has a cardiologist in [place]. Is he still going to fill this for her? Please advise. Thanks, MI

## 2013-01-31 ENCOUNTER — Encounter: Payer: Self-pay | Admitting: Internal Medicine

## 2013-02-01 ENCOUNTER — Other Ambulatory Visit (INDEPENDENT_AMBULATORY_CARE_PROVIDER_SITE_OTHER): Payer: Private Health Insurance - Indemnity

## 2013-02-01 ENCOUNTER — Encounter: Payer: Self-pay | Admitting: Internal Medicine

## 2013-02-01 ENCOUNTER — Ambulatory Visit (INDEPENDENT_AMBULATORY_CARE_PROVIDER_SITE_OTHER): Payer: Private Health Insurance - Indemnity | Admitting: Internal Medicine

## 2013-02-01 VITALS — BP 110/78 | HR 68 | Ht 64.0 in | Wt 267.8 lb

## 2013-02-01 DIAGNOSIS — K219 Gastro-esophageal reflux disease without esophagitis: Secondary | ICD-10-CM

## 2013-02-01 DIAGNOSIS — R933 Abnormal findings on diagnostic imaging of other parts of digestive tract: Secondary | ICD-10-CM

## 2013-02-01 DIAGNOSIS — R109 Unspecified abdominal pain: Secondary | ICD-10-CM

## 2013-02-01 LAB — COMPREHENSIVE METABOLIC PANEL
AST: 19 U/L (ref 0–37)
Albumin: 4 g/dL (ref 3.5–5.2)
Alkaline Phosphatase: 38 U/L — ABNORMAL LOW (ref 39–117)
BUN: 19 mg/dL (ref 6–23)
Calcium: 9.6 mg/dL (ref 8.4–10.5)
Creatinine, Ser: 1 mg/dL (ref 0.4–1.2)
Glucose, Bld: 130 mg/dL — ABNORMAL HIGH (ref 70–99)
Potassium: 4.7 mEq/L (ref 3.5–5.1)

## 2013-02-01 LAB — CBC
HCT: 43 % (ref 36.0–46.0)
Hemoglobin: 14.7 g/dL (ref 12.0–15.0)
RBC: 4.86 Mil/uL (ref 3.87–5.11)
WBC: 11.5 10*3/uL — ABNORMAL HIGH (ref 4.5–10.5)

## 2013-02-01 LAB — IGA: IgA: 413 mg/dL — ABNORMAL HIGH (ref 68–378)

## 2013-02-01 LAB — HIGH SENSITIVITY CRP: CRP, High Sensitivity: 11.33 mg/L — ABNORMAL HIGH (ref 0.000–5.000)

## 2013-02-01 MED ORDER — PANTOPRAZOLE SODIUM 40 MG PO TBEC: 40.0000 mg | DELAYED_RELEASE_TABLET | Freq: Every day | ORAL | Status: DC

## 2013-02-01 MED ORDER — MOVIPREP 100 G PO SOLR: ORAL | Status: DC

## 2013-02-01 NOTE — Patient Instructions (Signed)
You have been scheduled for a colonoscopy/Endoscopy with propofol. Please follow written instructions given to you at your visit today.  Please pick up your prep kit at the pharmacy within the next 1-3 days. If you use inhalers (even only as needed), please bring them with you on the day of your procedure. Your physician has requested that you go to www.startemmi.com and enter the access code given to you at your visit today. This web site gives a general overview about your procedure. However, you should still follow specific instructions given to you by our office regarding your preparation for the procedure.   Your physician has requested that you go to the basement forlab work before leaving today.  We will get clearance from Dr. Tenny Craw for this procedure                                               We are excited to introduce MyChart, a new best-in-class service that provides you online access to important information in your electronic medical record. We want to make it easier for you to view your health information - all in one secure location - when and where you need it. We expect MyChart will enhance the quality of care and service we provide.  When you register for MyChart, you can:    View your test results.    Request appointments and receive appointment reminders via email.    Request medication renewals.    View your medical history, allergies, medications and immunizations.    Communicate with your physician's office through a password-protected site.    Conveniently print information such as your medication lists.  To find out if MyChart is right for you, please talk to a member of our clinical staff today. We will gladly answer your questions about this free health and wellness tool.  If you are age 59 or older and want a member of your family to have access to your record, you must provide written consent by completing a proxy form available at our office. Please speak  to our clinical staff about guidelines regarding accounts for patients younger than age 34.  As you activate your MyChart account and need any technical assistance, please call the MyChart technical support line at [phone]) or email your question to [email or web address]. If you email your question(s), please include your name, a return phone number and the best time to reach you.  If you have non-urgent health-related questions, you can send a message to our office through MyChart at [email or web address]. If you have a medical emergency, call 911.  Thank you for using MyChart as your new health and wellness resource!   MyChart licensed from [employer],  4540-9811. Patents Pending.

## 2013-02-01 NOTE — Progress Notes (Addendum)
Patient ID: Aubery Date, female   DOB: [birth year], 57 y.o.   MRN: [id number] HPI: Jacob Chamblee is a 57 yo female with PMH of hereditary cardiomyopathy with ICD in place, hypertension, diverticulosis status post sigmoid resection for diverticulitis, fibromyalgia, meningioma status post resection, history of abdominal hysterectomy and cholecystectomy who is seen in followup. She is here today with her husband and mother.  She was seen by Mike Gip, PA-C on 01/08/2013 2 evaluate and acute episode of abdominal pain associated with nausea and vomiting.  She reports she has had episodes of this over the past 10 years, requiring hospitalization in 2005. On his most recent episode she had a CT scan of her abdomen and pelvis which showed numerous inflamed loops of distal small bowel with mesenteric stranding also involving the right colon with sparing of the cecum and appendix. He was treated with antibiotics, and today she reports she was improving over the last several weeks. She had a bad day yesterday with epigastric pain and severe heartburn. The pain was worsened with eating. Her bowel movements have continued to be erratic and can be hard or loose. She can go as many as 4-5 times per day but also skip days.  At times her heartburn can be quite severe and she has used some of her mother's Prilosec on an as-needed basis. She denies fevers or chills. She feels she has lost about 6 or 7 pounds. She denies chest pain or dyspnea today.  Also notable in her history is endometriosis, status post total hysterectomy and oophorectomy.    After her last visit with Amy, it was suggested that her ACE inhibitor may be leading to bowel associated angioedema is seen by her recent CT scan. To this point she has continue with her lisinopril and plans to discuss switching this medication with her cardiologist at her upcoming visit on 02/16/2013  She also reports being diagnosed and treated for H. pylori in 2011  Past  Medical History  Diagnosis Date  . Cardiomyopathy   . ICD (implantable cardiac defibrillator) in place   . Fibromyalgia   . CHF (congestive heart failure)   . Heart murmur   . Hypertension   . Depression   . Diverticulosis   . Gallstones   . IBS (irritable bowel syndrome)     Past Surgical History  Procedure Laterality Date  . Brain surgery  2004    Tumor  . Cholecystectomy    . Abdominal hysterectomy    . Cardiac catheterization  1983  . Colectomy  2001    sigmoid    Current Outpatient Prescriptions  Medication Sig Dispense Refill  . carvedilol (COREG) 25 MG tablet Take 1-1.5 tablets (25-37.5 mg total) by mouth 2 (two) times daily with a meal. 1 po in the am (25mg ) and 1 1/2 in the pm (37.5mg )  225 tablet  3  . ciprofloxacin (CIPRO) 500 MG tablet Take 1 tablet (500 mg total) by mouth every 12 (twelve) hours.  20 tablet  0  . clobetasol ointment (TEMOVATE) 0.05 % Apply 1 application topically 2 (two) times daily as needed (rash). As needed      . desonide (DESOWEN) 0.05 % ointment Apply 1 application topically 2 (two) times daily as needed (rash). As needed      . DULoxetine (CYMBALTA) 60 MG capsule Take 60 mg by mouth daily.      . fluocinonide ointment (LIDEX) 0.05 % Apply 1 application topically 2 (two) times daily as needed (rash). Solution as  needed      . furosemide (LASIX) 20 MG tablet Take 20-40 mg by mouth daily.      . Ibuprofen-Diphenhydramine Cit (IBUPROFEN PM) 200-38 MG TABS Take 2 tablets by mouth at bedtime as needed (sleep and pain).      Marland Kitchen lisinopril (PRINIVIL,ZESTRIL) 20 MG tablet Take 20 mg by mouth 2 (two) times daily.      . methylPREDNIsolone (MEDROL DOSPACK) 4 MG tablet follow package directions  21 tablet  2  . metroNIDAZOLE (FLAGYL) 500 MG tablet Take 1 tablet (500 mg total) by mouth 3 (three) times daily. One tab PO bid x 10 days  30 tablet  0  . naproxen sodium (ANAPROX) 220 MG tablet Take 220 mg by mouth 2 (two) times daily as needed. For pain.       Marland Kitchen spironolactone (ALDACTONE) 25 MG tablet Take 25 mg by mouth daily.      . traMADol (ULTRAM) 50 MG tablet Take 1 tablet (50 mg total) by mouth every 6 (six) hours as needed for pain.  30 tablet  0  . MOVIPREP 100 G SOLR Use per prep instruction  1 kit  0  . pantoprazole (PROTONIX) 40 MG tablet Take 1 tablet (40 mg total) by mouth daily.  90 tablet  3   No current facility-administered medications for this visit.    Allergies  Allergen Reactions  . Codeine Nausea And Vomiting  . Morphine And Related Nausea And Vomiting  . Penicillins Rash    Family History  Problem Relation Age of Onset  . Diabetes Mother   . Diabetes Maternal Grandmother   . Heart disease Mother   . Heart disease Father   . Heart disease Paternal Grandfather   . Heart disease Paternal Grandmother   . Heart disease Maternal Grandmother   . Lung cancer Paternal Grandmother     History  Substance Use Topics  . Smoking status: Never Smoker   . Smokeless tobacco: Never Used  . Alcohol Use: Yes     Comment: social- wine with dinner    ROS: As per history of present illness, otherwise negative  BP 110/78  Pulse 68  Ht 5\' 4"  (1.626 m)  Wt 267 lb 12.8 oz (121.473 kg)  BMI 45.95 kg/m2 Constitutional: Well-developed and well-nourished. No distress. HEENT: Normocephalic and atraumatic. Oropharynx is clear and moist. No oropharyngeal exudate. Conjunctivae are normal.  No scleral icterus. Neck: Neck supple. Trachea midline. Cardiovascular: Normal rate, regular rhythm and intact distal pulses.  Pulmonary/chest: Effort normal and breath sounds normal. No wheezing, rales or rhonchi. Abdominal: Soft, diffuse mild tenderness without rebound or guarding, nondistended. Bowel sounds active throughout Extremities: no clubbing, cyanosis, 1+ lower extremity edema Lymphadenopathy: No cervical adenopathy noted. Neurological: Alert and oriented to person place and time. Skin: Skin is warm and dry. No rashes  noted. Psychiatric: Normal mood and affect. Behavior is normal.  RELEVANT LABS AND IMAGING: CBC    Component Value Date/Time   WBC 9.5 01/05/2013 0941   WBC 10.2 03/22/2012 1811   RBC 4.51 01/05/2013 0941   RBC 4.92 03/22/2012 1811   HGB 13.6 01/05/2013 0941   HGB 14.1 03/22/2012 1811   HCT 40.0 01/05/2013 0941   HCT 46.5 03/22/2012 1811   PLT 249.0 01/05/2013 0941   MCV 88.8 01/05/2013 0941   MCV 94.6 03/22/2012 1811   MCH 30.4 01/01/2013 0844   MCH 28.7 03/22/2012 1811   MCHC 33.8 01/05/2013 0941   MCHC 30.3* 03/22/2012 1811   RDW 13.3  01/05/2013 0941   LYMPHSABS 2.2 01/05/2013 0941   MONOABS 0.6 01/05/2013 0941   EOSABS 0.2 01/05/2013 0941   BASOSABS 0.0 01/05/2013 0941   C1 Estrace inhibitor-normal  CMP     Component Value Date/Time   NA 135 01/01/2013 0844   K 4.4 01/01/2013 0844   CL 98 01/01/2013 0844   CO2 26 01/01/2013 0844   GLUCOSE 164* 01/01/2013 0844   BUN 26* 01/01/2013 0844   CREATININE 0.97 01/01/2013 0844   CREATININE 0.84 03/22/2012 1810   CALCIUM 9.4 01/01/2013 0844   PROT 7.4 01/01/2013 0844   ALBUMIN 3.5 01/01/2013 0844   AST 15 01/01/2013 0844   ALT 10 01/01/2013 0844   ALKPHOS 39 01/01/2013 0844   BILITOT 0.2* 01/01/2013 0844   GFRNONAA 63* 01/01/2013 0844   GFRAA 73* 01/01/2013 0844    CT ABDOMEN AND PELVIS WITH CONTRAST -- 01/01/2013   TECHNIQUE: Multidetector CT imaging of the abdomen and pelvis was performed using the standard protocol following bolus administration of intravenous contrast.   CONTRAST:  OMNIPAQUE IOHEXOL 300 MG/ML  SOLN   COMPARISON:  Lumbar radiographs 12/2011.   FINDINGS: Cardiac AICD or pacemaker leads partially visible. Mild cardiomegaly. No pericardial effusion.   Negative lung bases at except for mild scarring. No pleural effusions.   Lower lumbar disc and endplate degeneration. Lumbar facet degeneration. No acute osseous abnormality identified.   Small volume pelvic free fluid, does not appear related to the distal  large bowel which appears negative. There are diverticula in the sigmoid colon but no associated active inflammation identified. Small to moderate volume of fluid in the left pericolic gutter. No adjacent left colon inflammation identified. Likewise, transverse colon within normal limits.   There is pericolonic mesenteric stranding beginning at the hepatic flexure. The ascending colon also is affected. Distal small bowel loops also are abnormal and are hyper enhancing and indistinct. Scattered small bowel mesenteries a free fluid and stranding. Distal small bowel appears most affected. The cecum is located deep within the pelvis. The appendix and tip of the cecum do not appear inflamed. The terminal ileum is involved.   No dilated small bowel loops. The stomach is mildly distended with oral contrast and food. Duodenum within normal limits. Gallbladder surgically absent. Mildly decreased density throughout the liver. Negative spleen, pancreas and adrenal glands. Portal venous system within normal limits. No renal obstruction. Kidneys are within normal limits. Major arterial structures in the abdomen and pelvis are patent with mild atherosclerosis. No lymphadenopathy identified.   IMPRESSION: Numerous inflamed loops of distal small bowel with mesenteric stranding and free fluid. The right colon to the hepatic flexure also appears affected, but the tip of the cecum and appendix are spared. There is left pericolic gutter, without definite involvement of the distal colon. Findings compatible with acute nonspecific enterocolitis. Diverticulosis of the colon noted, but appears to be incidental at this time. No bowel obstruction.    ASSESSMENT/PLAN: 57 yo female with PMH of hereditary cardiomyopathy with ICD in place, hypertension, diverticulosis status post sigmoid resection for diverticulitis, fibromyalgia, meningioma status post resection, history of abdominal hysterectomy and  cholecystectomy who is seen in followup.   1.  Episodic abdominal pain/GERD/abnormal GI imaging -- it is difficult to completely understand the etiology of her enterocolitis seen by CT scan last month. On the whole it seems like she is better though she is still having intermittent abdominal pain and also significant reflux type symptoms. I have recommended initiation of PPI with pantoprazole 40  mg daily. We also discussed objective testing to understand the possible etiology of her colitis. After a thorough discussion of risks and benefits we will proceed to upper endoscopy and colonoscopy and outpatient hospital setting. This will be with monitored anesthesia care. Chronic ischemia as relates to her heart failure is also a possible etiology to her enterocolitis though one would have expected diarrhea. Laboratory bowel disease, such as Crohn's is also possible. I have recommended stool studies, celiac panel, CBC, CMP, ESR, and high sensitivity CRP.  Regarding her ACE inhibitor and enteric angioedema, this is possible, though rare, and if this medication can be substituted for an equally acceptable option it would be preferable. However,  Given her history of cardiomyopathy, I will defer this decision to her cardiologist, Dr. Tenny Craw.  Further recommendations after lab testing, endoscopies     Addendum Dr. Tenny Craw reviewed the chart it is okay proceeding with procedure. We will plan procedures as scheduled on 02/13/2013 in the hospital setting with MAC She will followup with cardiology later that week as scheduled

## 2013-02-02 NOTE — Progress Notes (Signed)
I saw her in July  She was doing good  Volume status was good  I think it is OK to proceed  Watch fluids.

## 2013-02-05 ENCOUNTER — Telehealth: Payer: Self-pay | Admitting: Gastroenterology

## 2013-02-05 ENCOUNTER — Ambulatory Visit: Payer: Private Health Insurance - Indemnity

## 2013-02-05 DIAGNOSIS — R933 Abnormal findings on diagnostic imaging of other parts of digestive tract: Secondary | ICD-10-CM

## 2013-02-05 DIAGNOSIS — R109 Unspecified abdominal pain: Secondary | ICD-10-CM

## 2013-02-05 DIAGNOSIS — K219 Gastro-esophageal reflux disease without esophagitis: Secondary | ICD-10-CM

## 2013-02-05 NOTE — Telephone Encounter (Signed)
lvm for pt to call me back regarding clearance for procedure on 02/13/2013

## 2013-02-06 ENCOUNTER — Encounter (HOSPITAL_COMMUNITY): Payer: Self-pay | Admitting: Pharmacy Technician

## 2013-02-06 ENCOUNTER — Encounter (HOSPITAL_COMMUNITY): Payer: Self-pay | Admitting: *Deleted

## 2013-02-06 ENCOUNTER — Encounter: Payer: Private Health Insurance - Indemnity | Admitting: Internal Medicine

## 2013-02-06 ENCOUNTER — Encounter: Payer: Self-pay | Admitting: Internal Medicine

## 2013-02-06 NOTE — Progress Notes (Signed)
CBC, CMP 02-01-13-results in Epic.

## 2013-02-07 LAB — GASTROINTESTINAL PATHOGEN PANEL PCR

## 2013-02-09 ENCOUNTER — Telehealth: Payer: Self-pay | Admitting: Internal Medicine

## 2013-02-09 NOTE — Telephone Encounter (Signed)
Notes Recorded by Linna Hoff, RN on 02/08/2013 at 4:26 PM lmom for pt to call back. Notes Recorded by Beverley Fiedler, MD on 02/08/2013 at 12:38 PM GI pathogen panel not performed due to solid stool This does not need to be repeated at this time, as the presence of solid stool argues against an active infectious process        Informed pt of results and she has questions about the results. She wants to know : if the IAG and CRP are high doesn't that mean something; also ESR was high? Please explain. Thanks.

## 2013-02-11 NOTE — Telephone Encounter (Signed)
ESR was upper limit of normal CRP is high which is a marker of inflammation.  We know that inflammation was seen in the bowel on recent CT scan and this could be a possible explanation of elevated CRP.  CRP elevation indicates inflammation, but is nonspecific. The upcoming EGD/colonoscopy are being performed to investigate her symptoms and abnl GI imaging (CT) IGA was very marginally elevated, and is also part of the immune system.  I checked it to ensure it wasn't low as can be seen with celiac disease.  Her celiac panel was negative She has procedures scheduled this week

## 2013-02-12 ENCOUNTER — Ambulatory Visit: Payer: Private Health Insurance - Indemnity | Admitting: Internal Medicine

## 2013-02-12 NOTE — Telephone Encounter (Signed)
Informed pt of Dr Pyrtle's findings/recommendations; pt stated understanding. 

## 2013-02-12 NOTE — Telephone Encounter (Signed)
lmom for pt to call back

## 2013-02-13 ENCOUNTER — Encounter (HOSPITAL_COMMUNITY): Payer: Private Health Insurance - Indemnity | Admitting: Certified Registered Nurse Anesthetist

## 2013-02-13 ENCOUNTER — Encounter (HOSPITAL_COMMUNITY): Payer: Self-pay | Admitting: *Deleted

## 2013-02-13 ENCOUNTER — Encounter (HOSPITAL_COMMUNITY)
Admission: RE | Disposition: A | Discharge status: Home / Self Care | Payer: Self-pay | Source: Ambulatory Visit | Attending: Internal Medicine

## 2013-02-13 ENCOUNTER — Ambulatory Visit (HOSPITAL_COMMUNITY): Payer: Private Health Insurance - Indemnity | Admitting: Certified Registered Nurse Anesthetist

## 2013-02-13 ENCOUNTER — Ambulatory Visit (HOSPITAL_COMMUNITY)
Admission: RE | Admit: 2013-02-13 | Discharge: 2013-02-13 | Disposition: A | Discharge status: Home / Self Care | Payer: Private Health Insurance - Indemnity | Source: Ambulatory Visit | Attending: Internal Medicine | Admitting: Internal Medicine

## 2013-02-13 DIAGNOSIS — I10 Essential (primary) hypertension: Secondary | ICD-10-CM | POA: Insufficient documentation

## 2013-02-13 DIAGNOSIS — K319 Disease of stomach and duodenum, unspecified: Secondary | ICD-10-CM | POA: Insufficient documentation

## 2013-02-13 DIAGNOSIS — I509 Heart failure, unspecified: Secondary | ICD-10-CM | POA: Insufficient documentation

## 2013-02-13 DIAGNOSIS — K620 Anal polyp: Secondary | ICD-10-CM | POA: Insufficient documentation

## 2013-02-13 DIAGNOSIS — I428 Other cardiomyopathies: Secondary | ICD-10-CM | POA: Insufficient documentation

## 2013-02-13 DIAGNOSIS — K589 Irritable bowel syndrome without diarrhea: Secondary | ICD-10-CM | POA: Insufficient documentation

## 2013-02-13 DIAGNOSIS — K219 Gastro-esophageal reflux disease without esophagitis: Secondary | ICD-10-CM

## 2013-02-13 DIAGNOSIS — A048 Other specified bacterial intestinal infections: Secondary | ICD-10-CM | POA: Insufficient documentation

## 2013-02-13 DIAGNOSIS — K2980 Duodenitis without bleeding: Secondary | ICD-10-CM | POA: Insufficient documentation

## 2013-02-13 DIAGNOSIS — IMO0001 Reserved for inherently not codable concepts without codable children: Secondary | ICD-10-CM | POA: Insufficient documentation

## 2013-02-13 DIAGNOSIS — R933 Abnormal findings on diagnostic imaging of other parts of digestive tract: Secondary | ICD-10-CM

## 2013-02-13 DIAGNOSIS — D126 Benign neoplasm of colon, unspecified: Secondary | ICD-10-CM

## 2013-02-13 DIAGNOSIS — K2960 Other gastritis without bleeding: Secondary | ICD-10-CM | POA: Insufficient documentation

## 2013-02-13 DIAGNOSIS — Z95810 Presence of automatic (implantable) cardiac defibrillator: Secondary | ICD-10-CM | POA: Insufficient documentation

## 2013-02-13 DIAGNOSIS — R109 Unspecified abdominal pain: Secondary | ICD-10-CM

## 2013-02-13 HISTORY — PX: ESOPHAGOGASTRODUODENOSCOPY (EGD) WITH PROPOFOL: SHX5813

## 2013-02-13 HISTORY — PX: COLONOSCOPY WITH PROPOFOL: SHX5780

## 2013-02-13 HISTORY — DX: Other complications of anesthesia, initial encounter: T88.59XA

## 2013-02-13 HISTORY — DX: Adverse effect of unspecified anesthetic, initial encounter: T41.45XA

## 2013-02-13 SURGERY — ESOPHAGOGASTRODUODENOSCOPY (EGD) WITH PROPOFOL
Anesthesia: Monitor Anesthesia Care

## 2013-02-13 MED ORDER — PROPOFOL INFUSION 10 MG/ML OPTIME
INTRAVENOUS | Status: DC | PRN
  Administered 2013-02-13: 180 ug/kg/min via INTRAVENOUS

## 2013-02-13 MED ORDER — SODIUM CHLORIDE 0.9 % IV SOLN: INTRAVENOUS | Status: DC

## 2013-02-13 MED ORDER — MIDAZOLAM HCL 5 MG/5ML IJ SOLN
INTRAMUSCULAR | Status: DC | PRN
  Administered 2013-02-13: 2 mg via INTRAVENOUS

## 2013-02-13 MED ORDER — LIDOCAINE HCL (CARDIAC) 20 MG/ML IV SOLN
INTRAVENOUS | Status: DC | PRN
  Administered 2013-02-13: 100 mg via INTRAVENOUS

## 2013-02-13 MED ORDER — LACTATED RINGERS IV SOLN
INTRAVENOUS | Status: DC
  Administered 2013-02-13: 1000 mL via INTRAVENOUS

## 2013-02-13 MED ORDER — KETAMINE HCL 10 MG/ML IJ SOLN
INTRAMUSCULAR | Status: DC | PRN
  Administered 2013-02-13: 20 mg via INTRAVENOUS

## 2013-02-13 MED ORDER — GLYCOPYRROLATE 0.2 MG/ML IJ SOLN
INTRAMUSCULAR | Status: DC | PRN
  Administered 2013-02-13: 0.2 mg via INTRAVENOUS

## 2013-02-13 MED ORDER — BUTAMBEN-TETRACAINE-BENZOCAINE 2-2-14 % EX AERO
INHALATION_SPRAY | CUTANEOUS | Status: DC | PRN
  Administered 2013-02-13: 2 via TOPICAL

## 2013-02-13 MED ORDER — ONDANSETRON HCL 4 MG/2ML IJ SOLN
INTRAMUSCULAR | Status: DC | PRN
  Administered 2013-02-13: 4 mg via INTRAVENOUS

## 2013-02-13 SURGICAL SUPPLY — 24 items

## 2013-02-13 NOTE — Interval H&P Note (Signed)
History and Physical Interval Note: No significant change since clinic visit. For EGD/colonoscopy today.The nature of the procedures, as well as the risks, benefits, and alternatives were carefully and thoroughly reviewed with the patient. Ample time for discussion and questions allowed. The patient understood, was satisfied, and agreed to proceed.     02/13/2013 9:22 AM  Whitney Jimenez  has presented today for surgery, with the diagnosis of GERD (gastroesophageal reflux disease) [530.81] Abnormal finding on GI tract imaging [793.4] Abdominal pain [789.00]  The various methods of treatment have been discussed with the patient and family. After consideration of risks, benefits and other options for treatment, the patient has consented to  Procedure(s): ESOPHAGOGASTRODUODENOSCOPY (EGD) WITH PROPOFOL (N/A) COLONOSCOPY WITH PROPOFOL (N/A) as a surgical intervention .  The patient's history has been reviewed, patient examined, no change in status, stable for surgery.  I have reviewed the patient's chart and labs.  Questions were answered to the patient's satisfaction.     Chelly Dombeck M

## 2013-02-13 NOTE — Anesthesia Preprocedure Evaluation (Addendum)
Anesthesia Evaluation  Patient identified by MRN, date of birth, ID band Patient awake    Reviewed: Allergy & Precautions, H&P , NPO status , Patient's Chart, lab work & pertinent test results, reviewed documented beta blocker date and time   Airway Mallampati: II TM Distance: >3 FB Neck ROM: full    Dental no notable dental hx. (+) Teeth Intact and Dental Advisory Given   Pulmonary neg pulmonary ROS,  breath sounds clear to auscultation  Pulmonary exam normal       Cardiovascular hypertension, Pt. on home beta blockers and Pt. on medications +CHF + Cardiac Defibrillator Rhythm:regular Rate:Normal  Cardiomyopathy with chronic systolic heart failure. Positional LBBB caused cardiac arrest in OR 12/82. LBBB   Neuro/Psych negative neurological ROS  negative psych ROS   GI/Hepatic negative GI ROS, Neg liver ROS,   Endo/Other  negative endocrine ROSMorbid obesity  Renal/GU negative Renal ROS  negative genitourinary   Musculoskeletal  (+) Fibromyalgia -  Abdominal (+) + obese,   Peds  Hematology negative hematology ROS (+)   Anesthesia Other Findings   Reproductive/Obstetrics negative OB ROS                         Anesthesia Physical Anesthesia Plan  ASA: III  Anesthesia Plan: MAC   Post-op Pain Management:    Induction:   Airway Management Planned: Simple Face Mask  Additional Equipment:   Intra-op Plan:   Post-operative Plan:   Informed Consent: I have reviewed the patients History and Physical, chart, labs and discussed the procedure including the risks, benefits and alternatives for the proposed anesthesia with the patient or authorized representative who has indicated his/her understanding and acceptance.   Dental Advisory Given  Plan Discussed with: CRNA and Surgeon  Anesthesia Plan Comments:         Anesthesia Quick Evaluation

## 2013-02-13 NOTE — Transfer of Care (Signed)
Immediate Anesthesia Transfer of Care Note  Patient: Whitney Jimenez  Procedure(s) Performed: Procedure(s) (LRB): ESOPHAGOGASTRODUODENOSCOPY (EGD) WITH PROPOFOL (N/A) COLONOSCOPY WITH PROPOFOL (N/A)  Patient Location: PACU  Anesthesia Type: MAC  Level of Consciousness: sedated, patient cooperative and responds to stimulation  Airway & Oxygen Therapy: Patient Spontanous Breathing and Patient connected to face mask oxgen  Post-op Assessment: Report given to PACU RN and Post -op Vital signs reviewed and stable  Post vital signs: Reviewed and stable  Complications: No apparent anesthesia complications

## 2013-02-13 NOTE — H&P (View-Only) (Signed)
Patient ID: Whitney Jimenez, female   DOB: 09/25/1955, 56 y.o.   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 She is here today with her husband and mother.  She was seen by Amy Esterwood, PA-C on 01/08/2013 2 evaluate and acute episode of abdominal pain associated with nausea and vomiting.  She reports she has had episodes of this over the past 10 years, requiring hospitalization in 2005. On his most recent episode she had a CT scan of her abdomen and pelvis which showed numerous inflamed loops of distal small bowel with mesenteric stranding also involving the right colon with sparing of the cecum and appendix. He was treated with antibiotics, and today she reports she was improving over the last several weeks. She had a bad day yesterday with epigastric pain and severe heartburn. The pain was worsened with eating. Her bowel movements have continued to be erratic and can be hard or loose. She can go as many as 4-5 times per day but also skip days.  At times her heartburn can be quite severe and she has used some of her mother's Prilosec on an as-needed basis. She denies fevers or chills. She feels she has lost about 6 or 7 pounds. She denies chest pain or dyspnea today.  Also notable in her history is endometriosis, status post total hysterectomy and oophorectomy.    After her last visit with Amy, it was suggested that her ACE inhibitor may be leading to bowel associated angioedema is seen by her recent CT scan. To this point she has continue with her lisinopril and plans to discuss switching this medication with her cardiologist at her upcoming visit on 02/16/2013  She also reports being diagnosed and treated for H. pylori in 2011  Past  Medical History  Diagnosis Date  . Cardiomyopathy   . ICD (implantable cardiac defibrillator) in place   . Fibromyalgia   . CHF (congestive heart failure)   . Heart murmur   . Hypertension   . Depression   . Diverticulosis   . Gallstones   . IBS (irritable bowel syndrome)     Past Surgical History  Procedure Laterality Date  . Brain surgery  2004    Tumor  . Cholecystectomy    . Abdominal hysterectomy    . Cardiac catheterization  1983  . Colectomy  2001    sigmoid    Current Outpatient Prescriptions  Medication Sig Dispense Refill  . carvedilol (COREG) 25 MG tablet Take 1-1.5 tablets (25-37.5 mg total) by mouth 2 (two) times daily with a meal. 1 po in the am (25mg) and 1 1/2 in the pm (37.5mg)  225 tablet  3  . ciprofloxacin (CIPRO) 500 MG tablet Take 1 tablet (500 mg total) by mouth every 12 (twelve) hours.  20 tablet  0  . clobetasol ointment (TEMOVATE) 0.05 % Apply 1 application topically 2 (two) times daily as needed (rash). As needed      . desonide (DESOWEN) 0.05 % ointment Apply 1 application topically 2 (two) times daily as needed (rash). As needed      . DULoxetine (CYMBALTA) 60 MG capsule Take 60 mg by mouth daily.      . fluocinonide ointment (LIDEX) 0.05 % Apply 1 application topically 2 (two) times daily as needed (rash). Solution as   needed      . furosemide (LASIX) 20 MG tablet Take 20-40 mg by mouth daily.      . Ibuprofen-Diphenhydramine Cit (IBUPROFEN PM) 200-38 MG TABS Take 2 tablets by mouth at bedtime as needed (sleep and pain).      . lisinopril (PRINIVIL,ZESTRIL) 20 MG tablet Take 20 mg by mouth 2 (two) times daily.      . methylPREDNIsolone (MEDROL DOSPACK) 4 MG tablet follow package directions  21 tablet  2  . metroNIDAZOLE (FLAGYL) 500 MG tablet Take 1 tablet (500 mg total) by mouth 3 (three) times daily. One tab PO bid x 10 days  30 tablet  0  . naproxen sodium (ANAPROX) 220 MG tablet Take 220 mg by mouth 2 (two) times daily as needed. For pain.       . spironolactone (ALDACTONE) 25 MG tablet Take 25 mg by mouth daily.      . traMADol (ULTRAM) 50 MG tablet Take 1 tablet (50 mg total) by mouth every 6 (six) hours as needed for pain.  30 tablet  0  . MOVIPREP 100 G SOLR Use per prep instruction  1 kit  0  . pantoprazole (PROTONIX) 40 MG tablet Take 1 tablet (40 mg total) by mouth daily.  90 tablet  3   No current facility-administered medications for this visit.    Allergies  Allergen Reactions  . Codeine Nausea And Vomiting  . Morphine And Related Nausea And Vomiting  . Penicillins Rash    Family History  Problem Relation Age of Onset  . Diabetes Mother   . Diabetes Maternal Grandmother   . Heart disease Mother   . Heart disease Father   . Heart disease Paternal Grandfather   . Heart disease Paternal Grandmother   . Heart disease Maternal Grandmother   . Lung cancer Paternal Grandmother     History  Substance Use Topics  . Smoking status: Never Smoker   . Smokeless tobacco: Never Used  . Alcohol Use: Yes     Comment: social- wine with dinner    ROS: As per history of present illness, otherwise negative  BP 110/78  Pulse 68  Ht 5' 4" (1.626 m)  Wt 267 lb 12.8 oz (121.473 kg)  BMI 45.95 kg/m2 Constitutional: Well-developed and well-nourished. No distress. HEENT: Normocephalic and atraumatic. Oropharynx is clear and moist. No oropharyngeal exudate. Conjunctivae are normal.  No scleral icterus. Neck: Neck supple. Trachea midline. Cardiovascular: Normal rate, regular rhythm and intact distal pulses.  Pulmonary/chest: Effort normal and breath sounds normal. No wheezing, rales or rhonchi. Abdominal: Soft, diffuse mild tenderness without rebound or guarding, nondistended. Bowel sounds active throughout Extremities: no clubbing, cyanosis, 1+ lower extremity edema Lymphadenopathy: No cervical adenopathy noted. Neurological: Alert and oriented to person place and time. Skin: Skin is warm and dry. No rashes  noted. Psychiatric: Normal mood and affect. Behavior is normal.  RELEVANT LABS AND IMAGING: CBC    Component Value Date/Time   WBC 9.5 01/05/2013 0941   WBC 10.2 03/22/2012 1811   RBC 4.51 01/05/2013 0941   RBC 4.92 03/22/2012 1811   HGB 13.6 01/05/2013 0941   HGB 14.1 03/22/2012 1811   HCT 40.0 01/05/2013 0941   HCT 46.5 03/22/2012 1811   PLT 249.0 01/05/2013 0941   MCV 88.8 01/05/2013 0941   MCV 94.6 03/22/2012 1811   MCH 30.4 01/01/2013 0844   MCH 28.7 03/22/2012 1811   MCHC 33.8 01/05/2013 0941   MCHC 30.3* 03/22/2012 1811   RDW 13.3   01/05/2013 0941   LYMPHSABS 2.2 01/05/2013 0941   MONOABS 0.6 01/05/2013 0941   EOSABS 0.2 01/05/2013 0941   BASOSABS 0.0 01/05/2013 0941   C1 Estrace inhibitor-normal  CMP     Component Value Date/Time   NA 135 01/01/2013 0844   K 4.4 01/01/2013 0844   CL 98 01/01/2013 0844   CO2 26 01/01/2013 0844   GLUCOSE 164* 01/01/2013 0844   BUN 26* 01/01/2013 0844   CREATININE 0.97 01/01/2013 0844   CREATININE 0.84 03/22/2012 1810   CALCIUM 9.4 01/01/2013 0844   PROT 7.4 01/01/2013 0844   ALBUMIN 3.5 01/01/2013 0844   AST 15 01/01/2013 0844   ALT 10 01/01/2013 0844   ALKPHOS 39 01/01/2013 0844   BILITOT 0.2* 01/01/2013 0844   GFRNONAA 63* 01/01/2013 0844   GFRAA 73* 01/01/2013 0844    CT ABDOMEN AND PELVIS WITH CONTRAST -- 01/01/2013   TECHNIQUE: Multidetector CT imaging of the abdomen and pelvis was performed using the standard protocol following bolus administration of intravenous contrast.   CONTRAST:  100mL OMNIPAQUE IOHEXOL 300 MG/ML  SOLN   COMPARISON:  Lumbar radiographs 12/2011.   FINDINGS: Cardiac AICD or pacemaker leads partially visible. Mild cardiomegaly. No pericardial effusion.   Negative lung bases at except for mild scarring. No pleural effusions.   Lower lumbar disc and endplate degeneration. Lumbar facet degeneration. No acute osseous abnormality identified.   Small volume pelvic free fluid, does not appear related to the distal  large bowel which appears negative. There are diverticula in the sigmoid colon but no associated active inflammation identified. Small to moderate volume of fluid in the left pericolic gutter. No adjacent left colon inflammation identified. Likewise, transverse colon within normal limits.   There is pericolonic mesenteric stranding beginning at the hepatic flexure. The ascending colon also is affected. Distal small bowel loops also are abnormal and are hyper enhancing and indistinct. Scattered small bowel mesenteries a free fluid and stranding. Distal small bowel appears most affected. The cecum is located deep within the pelvis. The appendix and tip of the cecum do not appear inflamed. The terminal ileum is involved.   No dilated small bowel loops. The stomach is mildly distended with oral contrast and food. Duodenum within normal limits. Gallbladder surgically absent. Mildly decreased density throughout the liver. Negative spleen, pancreas and adrenal glands. Portal venous system within normal limits. No renal obstruction. Kidneys are within normal limits. Major arterial structures in the abdomen and pelvis are patent with mild atherosclerosis. No lymphadenopathy identified.   IMPRESSION: Numerous inflamed loops of distal small bowel with mesenteric stranding and free fluid. The right colon to the hepatic flexure also appears affected, but the tip of the cecum and appendix are spared. There is left pericolic gutter, without definite involvement of the distal colon. Findings compatible with acute nonspecific enterocolitis. Diverticulosis of the colon noted, but appears to be incidental at this time. No bowel obstruction.    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   1.  Episodic abdominal pain/GERD/abnormal GI imaging -- it is difficult to completely understand the etiology of her enterocolitis seen by CT scan last month. On the whole it seems like she is better though she is still having intermittent abdominal pain and also significant reflux type symptoms. I have recommended initiation of PPI with pantoprazole 40   mg daily. We also discussed objective testing to understand the possible etiology of her colitis. After a thorough discussion of risks and benefits we will proceed to upper endoscopy and colonoscopy and outpatient hospital setting. This will be with monitored anesthesia care. Chronic ischemia as relates to her heart failure is also a possible etiology to her enterocolitis though one would have expected diarrhea. Laboratory bowel disease, such as Crohn's is also possible. I have recommended stool studies, celiac panel, CBC, CMP, ESR, and high sensitivity CRP.  Regarding her ACE inhibitor and enteric angioedema, this is possible, though rare, and if this medication can be substituted for an equally acceptable option it would be preferable. However,  Given her history of cardiomyopathy, I will defer this decision to her cardiologist, Dr. Ross.  Further recommendations after lab testing, endoscopies     Addendum Dr. Ross reviewed the chart it is okay proceeding with procedure. We will plan procedures as scheduled on 02/13/2013 in the hospital setting with MAC She will followup with cardiology later that week as scheduled  

## 2013-02-13 NOTE — Anesthesia Postprocedure Evaluation (Signed)
  Anesthesia Post-op Note  Patient: Whitney Jimenez  Procedure(s) Performed: Procedure(s) (LRB): ESOPHAGOGASTRODUODENOSCOPY (EGD) WITH PROPOFOL (N/A) COLONOSCOPY WITH PROPOFOL (N/A)  Patient Location: PACU  Anesthesia Type: MAC  Level of Consciousness: awake and alert   Airway and Oxygen Therapy: Patient Spontanous Breathing  Post-op Pain: mild  Post-op Assessment: Post-op Vital signs reviewed, Patient's Cardiovascular Status Stable, Respiratory Function Stable, Patent Airway and No signs of Nausea or vomiting  Last Vitals:  Filed Vitals:   02/13/13 1026  BP: 99/54  Pulse: 75  Temp:   Resp: 16    Post-op Vital Signs: stable   Complications: No apparent anesthesia complications

## 2013-02-13 NOTE — Op Note (Signed)
[facility] [address] [place], [address]   COLONOSCOPY PROCEDURE REPORT  PATIENT: Whitney Jimenez, Whitney Jimenez  MR#: [id number] BIRTHDATE: Jan 15, 1956 , 56  yrs. old GENDER: Female ENDOSCOPIST: Beverley Fiedler, MD PROCEDURE DATE:  02/13/2013 PROCEDURE:   Colonoscopy with cold biopsy polypectomy First Screening Colonoscopy - Avg.  risk and is 50 yrs.  old or older - No.  Prior Negative Screening - Now for repeat screening. N/A  History of Adenoma - Now for follow-up colonoscopy & has been > or = to 3 yrs.  N/A  Polyps Removed Today? Yes. ASA CLASS:   Class III INDICATIONS:Abdominal pain and an abnormal CT. MEDICATIONS: MAC sedation, administered by CRNA and See Anesthesia Report.  DESCRIPTION OF PROCEDURE:   After the risks benefits and alternatives of the procedure were thoroughly explained, informed consent was obtained.  A digital rectal exam revealed no rectal mass.   The Pentax Ped Colon [id number]  endoscope was introduced through the anus and advanced to the terminal ileum which was intubated for a short distance. No adverse events experienced. The quality of the prep was good, using MoviPrep  The instrument was then slowly withdrawn as the colon was fully examined.      COLON FINDINGS: The mucosa appeared normal in the terminal ileum. Three sessile polyps ranging between 3-53mm in size were found in the ascending colon, at the hepatic flexure, and in the rectum. Polypectomy was performed with cold forceps.  All resections were complete and all polyp tissue was completely retrieved.   There was mild scattered diverticulosis noted in the ascending colon, transverse colon, descending colon, and sigmoid colon.   The colon mucosa was otherwise normal with no evidence of inflammation. Retroflexed views revealed no abnormalities. The time to cecum=4 minutes 00 seconds.  Withdrawal time=13 minutes 00 seconds.  The scope was withdrawn and the procedure  completed.  COMPLICATIONS: There were no complications.  ENDOSCOPIC IMPRESSION: 1.   Normal mucosa in the terminal ileum 2.   Three sessile polyps ranging between 3-5mm in size were found in the ascending colon, at the hepatic flexure, and in the rectum; Polypectomy was performed with cold forceps 3.   There was mild diverticulosis noted in the ascending colon, transverse colon, descending colon, and sigmoid colon 4.   The colon mucosa was otherwise normal  RECOMMENDATIONS: 1.  Await pathology results 2.  Timing of repeat colonoscopy will be determined by pathology findings. 3.  You will receive a letter within 1-2 weeks with the results of your biopsy as well as final recommendations.  Please call my office if you have not received a letter after 3 weeks.   eSigned:  Beverley Fiedler, MD 02/13/2013 10:21 AM   cc: The Patient, Linda Hedges.  Plotnikov, MD, and Pricilla Riffle, MD   PATIENT NAME:  [name], [name] MR#: [id number]

## 2013-02-13 NOTE — Op Note (Signed)
[facility] [address] [place], [address]   ENDOSCOPY PROCEDURE REPORT  PATIENT: Whitney Jimenez, Whitney Jimenez  MR#: [id number] BIRTHDATE: 09/14/1955 , 56  yrs. old GENDER: Female ENDOSCOPIST: Beverley Fiedler, MD PROCEDURE DATE:  02/13/2013 PROCEDURE:  EGD w/ biopsy ASA CLASS:     Class III INDICATIONS:  Heartburn.   abdominal pain.   abnormal CT of the GI tract. MEDICATIONS: MAC sedation, administered by CRNA and See Anesthesia Report. TOPICAL ANESTHETIC: Cetacaine Spray  DESCRIPTION OF PROCEDURE: After the risks benefits and alternatives of the procedure were thoroughly explained, informed consent was obtained.  The PENTAX GASTOROSCOPE [id number] endoscope was introduced through the mouth and advanced to the second portion of the duodenum. Without limitations.  The instrument was slowly withdrawn as the mucosa was fully examined.     ESOPHAGUS: The mucosa of the esophagus appeared normal.   A normal Z-line was observed 39 cm from the incisors.  STOMACH: Small nodule was found on the lesser curvature of the gastric antrum.  Multiple biopsies were performed using cold forceps.   Congested and mild gastropathy was found in the cardia, gastric fundus, and gastric body.  Multiple biopsies were performed using cold forceps from the proximal and distal stomach.  DUODENUM: Mild duodenal inflammation characterized by erythema without ulceration was found in the duodenal bulb.   The duodenal mucosa showed no abnormalities in the 2nd part of the duodenum. Cold forceps biopsies were taken in the bulb and second portion.  Retroflexed views revealed no other abnormalities.     The scope was then withdrawn from the patient and the procedure completed.  COMPLICATIONS: There were no complications.  ENDOSCOPIC IMPRESSION: 1.   The mucosa of the esophagus appeared normal 2.   Normal Z-line was observed 39 cm from the incisors 3.   Nodule was found on the lesser curvature of the  gastric antrum; multiple biopsies 4.   Gastropathy was found in the cardia, gastric fundus, and gastric body; multiple biopsies 5.   Duodenal inflammation was found in the duodenal bulb; biopsy 6.   The duodenal mucosa showed no abnormalities in the 2nd part of the duodenum; multiple biopsies   RECOMMENDATIONS: 1.  Await pathology results 2.  Would begin pantoprazole as prescribed recently in clinic  eSigned:  Beverley Fiedler, MD 02/13/2013 10:15 AM   CC:The Patient, Linda Hedges.  Plotnikov, MD, and Pricilla Riffle, MD  PATIENT NAME:  Whitney Jimenez, Whitney Jimenez MR#: [id number]

## 2013-02-14 ENCOUNTER — Encounter (HOSPITAL_COMMUNITY): Payer: Self-pay | Admitting: Internal Medicine

## 2013-02-15 ENCOUNTER — Other Ambulatory Visit: Payer: Self-pay

## 2013-02-15 ENCOUNTER — Encounter: Payer: Self-pay | Admitting: Internal Medicine

## 2013-02-15 ENCOUNTER — Telehealth: Payer: Self-pay | Admitting: *Deleted

## 2013-02-15 NOTE — Telephone Encounter (Signed)
Message copied by Florene Glen on Thu Feb 15, 2013  2:54 PM ------      Message from: Beverley Fiedler      Created: Thu Feb 15, 2013 10:08 AM       Path results back -- H Pylori gastritis.  The stomach nodule is benign and likely inflammatory related to H pylori.      Also some changes in the duodenum which are felt H pylori related.      2 colon polyps were adenomatous, but no high grade dysplasia or cancer, repeat colon in 5 years      Please treat with Pylera with BID PPI      Office followup in about 1 month ------

## 2013-02-15 NOTE — Telephone Encounter (Signed)
lmtcb

## 2013-02-16 ENCOUNTER — Ambulatory Visit (INDEPENDENT_AMBULATORY_CARE_PROVIDER_SITE_OTHER): Payer: Private Health Insurance - Indemnity | Admitting: Internal Medicine

## 2013-02-16 VITALS — BP 164/80 | HR 86 | Ht 64.0 in | Wt 270.0 lb

## 2013-02-16 DIAGNOSIS — I5020 Unspecified systolic (congestive) heart failure: Secondary | ICD-10-CM

## 2013-02-16 DIAGNOSIS — I10 Essential (primary) hypertension: Secondary | ICD-10-CM

## 2013-02-16 MED ORDER — LOSARTAN POTASSIUM 100 MG PO TABS: 100.0000 mg | ORAL_TABLET | Freq: Every day | ORAL | Status: DC

## 2013-02-16 MED ORDER — BIS SUBCIT-METRONID-TETRACYC 140-125-125 MG PO CAPS: 3.0000 | ORAL_CAPSULE | Freq: Three times a day (TID) | ORAL | Status: DC

## 2013-02-16 MED ORDER — PANTOPRAZOLE SODIUM 40 MG PO TBEC: 40.0000 mg | DELAYED_RELEASE_TABLET | Freq: Two times a day (BID) | ORAL | Status: DC

## 2013-02-16 NOTE — Progress Notes (Signed)
HPI She was diagnosed with CHF in 2000. LVEF was initially in 20s She has had a cardiac cath in 2009 which showd no CAD. Last echo LVEF was 45% on echo in Jan 2013 She has known LBBB (old, present by report in 1082) She has had 3 AICDs. First rplaced due to faulty lead in 2005. Last was put in in Fall 2013. (ERI)  She had never had a CHF exacerbation When I saw her earlier this winter her volume status looked good  After seeing her I recomm an echo This was done and showed an LVEF of 25 to 30%. She saw Rosette Reveal in Sept  Plan was to not make any device changes   The patient says her breathing has been rel stable. Does get SOB at times. Intermittent edema which is stable  Patient undergoing GI eval  Found to be + ro H pylori  CT done of abdomen which showed edema of gut    Allergies  Allergen Reactions  . Snail Extract Nausea And Vomiting  . Codeine Nausea And Vomiting  . Morphine And Related Nausea And Vomiting  . Penicillins Rash    Current Outpatient Prescriptions  Medication Sig Dispense Refill  . bismuth-metronidazole-tetracycline (PYLERA) 140-125-125 MG per capsule Take 3 capsules by mouth 4 (four) times daily -  before meals and at bedtime.  120 capsule  0  . carvedilol (COREG) 25 MG tablet Take 25-37.5 mg by mouth 2 (two) times daily with a meal. 2 po in the am (25mg ) and 1 1/2 in the pm (37.5mg )      . ciprofloxacin (CIPRO) 500 MG tablet Take 500 mg by mouth 2 (two) times daily.       . clobetasol ointment (TEMOVATE) 0.05 % Apply 1 application topically 2 (two) times daily as needed (rash). As needed      . desonide (DESOWEN) 0.05 % ointment Apply 1 application topically 2 (two) times daily as needed (rash). As needed      . DULoxetine (CYMBALTA) 60 MG capsule Take 60 mg by mouth daily.      . fluocinonide ointment (LIDEX) 0.05 % Apply 1 application topically 2 (two) times daily as needed (rash). Solution as needed      . furosemide (LASIX) 20 MG tablet Take 20-40 mg by mouth daily.       . Ibuprofen-Diphenhydramine Cit (IBUPROFEN PM) 200-38 MG TABS Take 2 tablets by mouth at bedtime as needed (sleep and pain).      Marland Kitchen lisinopril (PRINIVIL,ZESTRIL) 20 MG tablet Take 20 mg by mouth 2 (two) times daily.      . metroNIDAZOLE (FLAGYL) 500 MG tablet Take 500 mg by mouth 3 (three) times daily.       Marland Kitchen MOVIPREP 100 G SOLR Use per prep instruction  1 kit  0  . naproxen sodium (ANAPROX) 220 MG tablet Take 220 mg by mouth 2 (two) times daily as needed. For pain.      . pantoprazole (PROTONIX) 40 MG tablet Take 1 tablet (40 mg total) by mouth 2 (two) times daily.  20 tablet  0  . spironolactone (ALDACTONE) 25 MG tablet Take 50 mg by mouth daily.       . traMADol (ULTRAM) 50 MG tablet Take 50 mg by mouth every 6 (six) hours as needed for pain.       No current facility-administered medications for this visit.    Past Medical History  Diagnosis Date  . Cardiomyopathy   . ICD (implantable cardiac  defibrillator) in place     Medtronic ICD  . Fibromyalgia   . CHF (congestive heart failure)   . Heart murmur   . Hypertension   . Depression   . Diverticulosis   . Gallstones   . IBS (irritable bowel syndrome)   . Complication of anesthesia     12'82-Cardiac Arrest in OR"positional LBBB"    Past Surgical History  Procedure Laterality Date  . Abdominal hysterectomy    . Colectomy  2001    sigmoid  . Cholecystectomy      '82  . Cardiac catheterization  1983    last 2010,-[place]  . Brain surgery  2004    "meningioma"Tumor-retained Titanium plate to cover skull  . Icd implanted Left     Medtronic  . Esophagogastroduodenoscopy (egd) with propofol N/A 02/13/2013    Procedure: ESOPHAGOGASTRODUODENOSCOPY (EGD) WITH PROPOFOL;  Surgeon: Beverley Fiedler, MD;  Location: [facility];  Service: Gastroenterology;  Laterality: N/A;  . Colonoscopy with propofol N/A 02/13/2013    Procedure: COLONOSCOPY WITH PROPOFOL;  Surgeon: Beverley Fiedler, MD;  Location: [facility];  Service:  Gastroenterology;  Laterality: N/A;    Family History  Problem Relation Age of Onset  . Diabetes Mother   . Diabetes Maternal Grandmother   . Heart disease Mother   . Heart disease Father   . Heart disease Paternal Grandfather   . Heart disease Paternal Grandmother   . Heart disease Maternal Grandmother   . Lung cancer Paternal Grandmother     History   Social History  . Marital Status: Married    Spouse Name: N/A    Number of Children: 2  . Years of Education: N/A   Occupational History  . retired    Social History Main Topics  . Smoking status: Never Smoker   . Smokeless tobacco: Never Used  . Alcohol Use: Yes     Comment: social- wine with dinner  . Drug Use: No  . Sexual Activity: Not on file   Other Topics Concern  . Not on file   Social History Narrative  . No narrative on file    Review of Systems:  All systems reviewed.  They are negative to the above problem except as previously stated.  Vital Signs: BP 164/80  Pulse 86  Ht 5\' 4"  (1.626 m)  Wt 270 lb (122.471 kg)  BMI 46.32 kg/m2 BP on my check 148/84 Physical Exam  HEENT:  Normocephalic, atraumatic. EOMI, PERRLA.  Neck: JVP is normal.  No bruits.  Lungs: clear to auscultation. No rales no wheezes.  Heart: Regular rate and rhythm. Normal S1, S2. No S3.   No significant murmurs. PMI not displaced.  Abdomen:  Supple, nontender. Normal bowel sounds. No masses. No hepatomegaly.  Extremities:   Good distal pulses throughout. No lower extremity edema.  Musculoskeletal :moving all extremities.  Neuro:   alert and oriented x3.  CN II-XII grossly intact.   Assessment and Plan:  1.  Chronic systolic CHF  Volume status looks OK  I would check labs.   Note from GI consideration that lisinopril may be contrib to edema in small bowel  I think it is reasonable to switch to Cozaar 100  Follow  2.  HTN  BP is high today  She took her meds  1 hour before visit  Follow  3.  GI  As noted above  Patient also  found to be + for H Pylori (has been treated in past)  Will begin Rx.

## 2013-02-16 NOTE — Telephone Encounter (Signed)
Lmtcb; we are playing phone tag

## 2013-02-16 NOTE — Patient Instructions (Signed)
Stop Lisinopril   Start Cozaar 100 mg daily    Your physician wants you to follow-up in: 4 months. You will receive a reminder letter in the mail two months in advance. If you don't receive a letter, please call our office to schedule the follow-up appointment.

## 2013-02-16 NOTE — Telephone Encounter (Signed)
Informed pt of H Pylori and ordered her meds; will mail her appt.

## 2013-03-05 ENCOUNTER — Telehealth: Payer: Self-pay | Admitting: Internal Medicine

## 2013-03-05 NOTE — Telephone Encounter (Signed)
Spoke with patient and she is has a vaginal yeast infection from the H.pylori antibiotics. Please, advise.

## 2013-03-05 NOTE — Telephone Encounter (Signed)
Okay for fluconazole 150 mg po x 1

## 2013-03-06 MED ORDER — FLUCONAZOLE 150 MG PO TABS: ORAL_TABLET | ORAL | Status: DC

## 2013-03-06 NOTE — Telephone Encounter (Signed)
Rx sent. Left a message for patient that rx was sent.

## 2013-03-16 ENCOUNTER — Ambulatory Visit (INDEPENDENT_AMBULATORY_CARE_PROVIDER_SITE_OTHER): Payer: Private Health Insurance - Indemnity | Admitting: Internal Medicine

## 2013-03-16 ENCOUNTER — Telehealth: Payer: Self-pay | Admitting: Internal Medicine

## 2013-03-16 ENCOUNTER — Encounter: Payer: Self-pay | Admitting: Internal Medicine

## 2013-03-16 VITALS — BP 142/80 | HR 80 | Temp 97.2°F | Ht 64.0 in | Wt 272.0 lb

## 2013-03-16 DIAGNOSIS — H60399 Other infective otitis externa, unspecified ear: Secondary | ICD-10-CM

## 2013-03-16 DIAGNOSIS — H60391 Other infective otitis externa, right ear: Secondary | ICD-10-CM

## 2013-03-16 MED ORDER — CIPROFLOXACIN HCL 500 MG PO TABS: 500.0000 mg | ORAL_TABLET | Freq: Two times a day (BID) | ORAL | Status: DC

## 2013-03-16 MED ORDER — NEOMYCIN-POLYMYXIN-HC 3.5-10000-1 OT SOLN: 4.0000 [drp] | Freq: Four times a day (QID) | OTIC | Status: DC

## 2013-03-16 NOTE — Assessment & Plan Note (Signed)
Mild to mod, for antibx course,  to f/u any worsening symptoms or concerns 

## 2013-03-16 NOTE — Progress Notes (Signed)
Subjective:    Patient ID: Whitney Jimenez, female    DOB: 06-Dec-1955, 57 y.o.   MRN: [id number]  HPI  Here with acute onset right ear ache with pain mild to mod, 3 days, radiating to neck below the ear, keeps her awake at night, not better or worse with anythinig. Past Medical History  Diagnosis Date  . Cardiomyopathy   . ICD (implantable cardiac defibrillator) in place     Medtronic ICD  . Fibromyalgia   . CHF (congestive heart failure)   . Heart murmur   . Hypertension   . Depression   . Diverticulosis   . Gallstones   . IBS (irritable bowel syndrome)   . Complication of anesthesia     12'82-Cardiac Arrest in OR"positional LBBB"   Past Surgical History  Procedure Laterality Date  . Abdominal hysterectomy    . Colectomy  2001    sigmoid  . Cholecystectomy      '82  . Cardiac catheterization  1983    last 2010,-[place]  . Brain surgery  2004    "meningioma"Tumor-retained Titanium plate to cover skull  . Icd implanted Left     Medtronic  . Esophagogastroduodenoscopy (egd) with propofol N/A 02/13/2013    Procedure: ESOPHAGOGASTRODUODENOSCOPY (EGD) WITH PROPOFOL;  Surgeon: Beverley Fiedler, MD;  Location: [facility];  Service: Gastroenterology;  Laterality: N/A;  . Colonoscopy with propofol N/A 02/13/2013    Procedure: COLONOSCOPY WITH PROPOFOL;  Surgeon: Beverley Fiedler, MD;  Location: [facility];  Service: Gastroenterology;  Laterality: N/A;    reports that she has never smoked. She has never used smokeless tobacco. She reports that she drinks alcohol. She reports that she does not use illicit drugs. family history includes Diabetes in her maternal grandmother and mother; Heart disease in her father, maternal grandmother, mother, paternal grandfather, and paternal grandmother; Lung cancer in her paternal grandmother. Allergies  Allergen Reactions  . Snail Extract Nausea And Vomiting  . Codeine Nausea And Vomiting  . Morphine And Related Nausea And Vomiting  . Penicillins  Rash   Current Outpatient Prescriptions on File Prior to Visit  Medication Sig Dispense Refill  . bismuth-metronidazole-tetracycline (PYLERA) 140-125-125 MG per capsule Take 3 capsules by mouth 4 (four) times daily -  before meals and at bedtime.  120 capsule  0  . carvedilol (COREG) 25 MG tablet Take 25-37.5 mg by mouth 2 (two) times daily with a meal. 2 po in the am (25mg ) and 1 1/2 in the pm (37.5mg )      . clobetasol ointment (TEMOVATE) 0.05 % Apply 1 application topically 2 (two) times daily as needed (rash). As needed      . desonide (DESOWEN) 0.05 % ointment Apply 1 application topically 2 (two) times daily as needed (rash). As needed      . DULoxetine (CYMBALTA) 60 MG capsule Take 60 mg by mouth daily.      . fluconazole (DIFLUCAN) 150 MG tablet Take one tablet po  1 tablet  0  . fluocinonide ointment (LIDEX) 0.05 % Apply 1 application topically 2 (two) times daily as needed (rash). Solution as needed      . furosemide (LASIX) 20 MG tablet Take 20-40 mg by mouth daily.      . Ibuprofen-Diphenhydramine Cit (IBUPROFEN PM) 200-38 MG TABS Take 2 tablets by mouth at bedtime as needed (sleep and pain).      Marland Kitchen losartan (COZAAR) 100 MG tablet Take 1 tablet (100 mg total) by mouth daily.  30 tablet  6  . metroNIDAZOLE (FLAGYL) 500 MG tablet Take 500 mg by mouth 3 (three) times daily.       Marland Kitchen MOVIPREP 100 G SOLR Use per prep instruction  1 kit  0  . naproxen sodium (ANAPROX) 220 MG tablet Take 220 mg by mouth 2 (two) times daily as needed. For pain.      . pantoprazole (PROTONIX) 40 MG tablet Take 1 tablet (40 mg total) by mouth 2 (two) times daily.  20 tablet  0  . spironolactone (ALDACTONE) 25 MG tablet Take 50 mg by mouth daily.       . traMADol (ULTRAM) 50 MG tablet Take 50 mg by mouth every 6 (six) hours as needed for pain.       No current facility-administered medications on file prior to visit.   Review of Systems All otherwise neg per pt     Objective:   Physical Exam BP 142/80   Pulse 80  Temp(Src) 97.2 F (36.2 C) (Oral)  Ht 5\' 4"  (1.626 m)  Wt 272 lb (123.378 kg)  BMI 46.67 kg/m2  SpO2 92% VS noted, mild ill appearing Constitutional: Pt appears well-developed and well-nourished. /morbid obese HENT: Head: NCAT.  Right Ear: External ear normal.  Left Ear: External ear normal.  Eyes: Conjunctivae and EOM are normal. Pupils are equal, round, and reactive to light.  Right ext canal with 1-2+ red/tender/swelling, slight d/cmucoid Neck: Normal range of motion. Neck supple.  Cardiovascular: Normal rate and regular rhythm.   Pulmonary/Chest: Effort normal and breath sounds normal.  Neurological: Pt is alert. Not confused  Skin: Skin is warm. No erythema.  Psychiatric: Pt behavior is normal. Thought content normal.     Assessment & Plan:

## 2013-03-16 NOTE — Patient Instructions (Signed)
Please take all new medication as prescribed - the ear drops with the pills only if you feel you are not better by Sunday Please continue all other medications as before, and refills have been done if requested.  Please remember to sign up for My Chart if you have not done so, as this will be important to you in the future with finding out test results, communicating by private email, and scheduling acute appointments online when needed.

## 2013-03-16 NOTE — Telephone Encounter (Signed)
Pt was transferred into our [facility] system from previous clinic. Pt aware taken care of and transmission will come to our clinic.

## 2013-03-16 NOTE — Telephone Encounter (Signed)
New Problem:  Pt wants to know if her remote device equipment is set up for her remote appt on 12/8. Pt is requesting a call back.

## 2013-03-16 NOTE — Progress Notes (Signed)
Pre-visit discussion using our clinic review tool. No additional management support is needed unless otherwise documented below in the visit note.  

## 2013-03-19 ENCOUNTER — Encounter: Payer: Self-pay | Admitting: Internal Medicine

## 2013-03-19 ENCOUNTER — Encounter: Payer: Private Health Insurance - Indemnity | Admitting: *Deleted

## 2013-03-20 ENCOUNTER — Ambulatory Visit (INDEPENDENT_AMBULATORY_CARE_PROVIDER_SITE_OTHER): Payer: Private Health Insurance - Indemnity | Admitting: Internal Medicine

## 2013-03-20 ENCOUNTER — Encounter: Payer: Self-pay | Admitting: Internal Medicine

## 2013-03-20 VITALS — BP 130/70 | HR 74 | Ht 64.0 in | Wt 274.4 lb

## 2013-03-20 DIAGNOSIS — R109 Unspecified abdominal pain: Secondary | ICD-10-CM

## 2013-03-20 DIAGNOSIS — K2970 Gastritis, unspecified, without bleeding: Secondary | ICD-10-CM

## 2013-03-20 DIAGNOSIS — K219 Gastro-esophageal reflux disease without esophagitis: Secondary | ICD-10-CM

## 2013-03-20 DIAGNOSIS — D126 Benign neoplasm of colon, unspecified: Secondary | ICD-10-CM

## 2013-03-20 DIAGNOSIS — Z8619 Personal history of other infectious and parasitic diseases: Secondary | ICD-10-CM

## 2013-03-20 MED ORDER — PANTOPRAZOLE SODIUM 40 MG PO TBEC: 40.0000 mg | DELAYED_RELEASE_TABLET | Freq: Every day | ORAL | Status: DC

## 2013-03-20 NOTE — Patient Instructions (Signed)
Your physician has requested that you go to the basement for lab work before leaving. H Pylori stool antigen.  You will need to hold your PPI (Pantoprazole) for 2 weeks prior to the lab.  Dr. Rhea Belton recommends your husband be checked for H. Pylori infection.   We have sent the following medications to your pharmacy for you to pick up at your convenience: Pantoprazole 40 mg daily   Follow up with Dr. Rhea Belton in office in 4-5 months.                                               We are excited to introduce MyChart, a new best-in-class service that provides you online access to important information in your electronic medical record. We want to make it easier for you to view your health information - all in one secure location - when and where you need it. We expect MyChart will enhance the quality of care and service we provide.  When you register for MyChart, you can:    View your test results.    Request appointments and receive appointment reminders via email.    Request medication renewals.    View your medical history, allergies, medications and immunizations.    Communicate with your physician's office through a password-protected site.    Conveniently print information such as your medication lists.  To find out if MyChart is right for you, please talk to a member of our clinical staff today. We will gladly answer your questions about this free health and wellness tool.  If you are age 64 or older and want a member of your family to have access to your record, you must provide written consent by completing a proxy form available at our office. Please speak to our clinical staff about guidelines regarding accounts for patients younger than age 53.  As you activate your MyChart account and need any technical assistance, please call the MyChart technical support line at [phone]) or email your question to [email or web address]. If you email your question(s),  please include your name, a return phone number and the best time to reach you.  If you have non-urgent health-related questions, you can send a message to our office through MyChart at [email or web address]. If you have a medical emergency, call 911.  Thank you for using MyChart as your new health and wellness resource!   MyChart licensed from [employer],  [id number]. Patents Pending.

## 2013-03-20 NOTE — Progress Notes (Signed)
Subjective:    Patient ID: Whitney Jimenez, female    DOB: 03/27/1956, 57 y.o.   MRN: [id number]  HPI Whitney Jimenez is a 57 yo female with PMH of hereditary cardiomyopathy with ICD in place, hypertension, diverticulosis status post sigmoid resection for diverticulitis, fibromyalgia, meningioma status post resection, history of abdominal hysterectomy and cholecystectomy, and recent H. pylori gastritis who is seen in followup. She is here today with her husband and mother. She was seen initially in September to evaluate an acute episode of abdominal pain during which time a CT scan of the abdomen and pelvis showed numerous inflamed loops of distal small bowel mesenteric stranding involving the right colon as well. She was treated with antibiotics at that time. She then came for upper endoscopy and colonoscopy which were performed on 02/13/2013. Upper endoscopy revealed gastropathy in the proximal stomach with an antral nodule. Gastric biopsies were performed. There was also an duodenal bulb inflammation. College he showed moderate chronic active H. pylori gastritis the antral nodule was similar inflammation and felt to be a prominent foveolar compartment.  Duodenal biopsies revealed gastric Ealer metaplasia, variable villous blunting and nonspecific inflammation felt to feature duodenal involvement by H. Pylori.  Colonoscopy performed on the same day revealed normal terminal ileal mucosa and 3 sessile polyps measuring 3-5 mm in the ascending colon, hepatic flexure, and rectum. 2 of these polyps were found to be tubular adenomas without high-grade dysplasia and a rectal polyp was hyperplastic.  She did have diverticulosis in the ascending, transverse, descending, and sigmoid colon.  She was treated with Pylera which she completed for 10 days. He has continue to take pantoprazole 40 mg once daily.  Overall she has been feeling well. She has very subtle "tweaks" of abdominal pain but this is fleeting and very  short lived. She reports a good appetite with no nausea or vomiting. She is having 3-4 soft and occasionally loose stools daily. Normal color though occasionally dark green. She did see Dr. Tenny Craw and her ACE inhibitor was switched to losartan. She has tolerated this well. She has ciprofloxacin and metronidazole written on her medication list though she is not taking these at present. He was given Cipro recently for an ear infection but did not fill it and doesn't plan to.  She also reports being diagnosed and treated for H. pylori in 2011  Review of Systems As per history of present illness, otherwise negative  Current Medications, Allergies, Past Medical History, Past Surgical History, Family History and Social History were reviewed in [employer] record.     Objective:   Physical Exam BP 130/70  Pulse 74  Ht 5\' 4"  (1.626 m)  Wt 274 lb 6.4 oz (124.467 kg)  BMI 47.08 kg/m2 Constitutional: Well-developed and well-nourished. No distress. HEENT: Normocephalic and atraumatic. Oropharynx is clear and moist. No oropharyngeal exudate. Conjunctivae are normal.  No scleral icterus. Neck: Neck supple. Trachea midline. Cardiovascular: Normal rate, regular rhythm and intact distal pulses. No M/R/G Pulmonary/chest: Effort normal and breath sounds normal. No wheezing, rales or rhonchi. Abdominal: Soft, nontender, nondistended. Bowel sounds active throughout. There are no masses palpable. No hepatosplenomegaly. Extremities: no clubbing, cyanosis, or edema Lymphadenopathy: No cervical adenopathy noted. Neurological: Alert and oriented to person place and time. Skin: Skin is warm and dry. No rashes noted. Psychiatric: Normal mood and affect. Behavior is normal.  CBC    Component Value Date/Time   WBC 11.5* 02/01/2013 1036   WBC 10.2 03/22/2012 1811   RBC 4.86  02/01/2013 1036   RBC 4.92 03/22/2012 1811   HGB 14.7 02/01/2013 1036   HGB 14.1 03/22/2012 1811   HCT 43.0  02/01/2013 1036   HCT 46.5 03/22/2012 1811   PLT 241.0 02/01/2013 1036   MCV 88.4 02/01/2013 1036   MCV 94.6 03/22/2012 1811   MCH 30.4 01/01/2013 0844   MCH 28.7 03/22/2012 1811   MCHC 34.2 02/01/2013 1036   MCHC 30.3* 03/22/2012 1811   RDW 14.0 02/01/2013 1036   LYMPHSABS 2.2 01/05/2013 0941   MONOABS 0.6 01/05/2013 0941   EOSABS 0.2 01/05/2013 0941   BASOSABS 0.0 01/05/2013 0941    CMP     Component Value Date/Time   NA 138 02/01/2013 1036   K 4.7 02/01/2013 1036   CL 98 02/01/2013 1036   CO2 31 02/01/2013 1036   GLUCOSE 130* 02/01/2013 1036   BUN 19 02/01/2013 1036   CREATININE 1.0 02/01/2013 1036   CREATININE 0.84 03/22/2012 1810   CALCIUM 9.6 02/01/2013 1036   PROT 8.2 02/01/2013 1036   ALBUMIN 4.0 02/01/2013 1036   AST 19 02/01/2013 1036   ALT 21 02/01/2013 1036   ALKPHOS 38* 02/01/2013 1036   BILITOT 0.3 02/01/2013 1036   GFRNONAA 63* 01/01/2013 0844   GFRAA 73* 01/01/2013 0844   Erythrocyte Sedimentation Rate     Component Value Date/Time   ESRSEDRATE 22 02/01/2013 1036   TTG normal     Assessment & Plan:  57 yo female with PMH of hereditary cardiomyopathy with ICD in place, hypertension, diverticulosis status post sigmoid resection for diverticulitis, fibromyalgia, meningioma status post resection, history of abdominal hysterectomy and cholecystectomy, and recent H. pylori gastritis who is seen in followup.  1.  H. pylori gastritis -- she has been treated with Pylera. She had infection in 2001 which was also treated. We discussed how I am uncertain if this was inadequately treated infection or a recurrent infection.  We have discussed how H. pylori is transmitted and it is possible that her husband is infected and this has led a reinfection, though reinfection is quite rare.  I would like to confirm eradication of PPI therapy with H. pylori stool antigen. This has been ordered today. After discussion with her and her husband, we will also test her husband stool with  H. pylori antigen  2.  GERD -- well-controlled at present, we'll continue pantoprazole 40 mg once daily for now  3.  Episodic abdominal pain/abnormal GI imaging -- no further attacks of abdominal pain in the last several months. Unclear what caused the inflammation in the distal small bowel and right colon, and no evidence of IBD was found at the time of her colonoscopy including intubation of the distal terminal ileum.  Given that she has continued to feel well no further imaging or small bowel imaging such as capsule endoscopy will be pursued at present. She understands this and agrees. Other possibilities include angioedema associated with ACE inhibitors. This is rare and unlikely but not impossible. Her ACE inhibitor has been changed to ARB.  An episode of ischemia also occurred though this is also thought unlikely. She will notify me if she develops similar symptoms going forward.  4. Adenomatous colon polyps -- 2 subcentimeter colon polyps. We discussed national guidelines would recommend repeat surveillance colonoscopy in 5 years.  Followup in 4-5 months, sooner if necessary

## 2013-03-23 ENCOUNTER — Telehealth: Payer: Self-pay | Admitting: Internal Medicine

## 2013-03-23 NOTE — Telephone Encounter (Signed)
New Message  Remote Medtronic not working/// Unable send signals.. Dialed [phone]#.Marland Kitchen Still not working please assist

## 2013-03-26 NOTE — Telephone Encounter (Signed)
Pt scheduled for device check in office 03-28-13 @ 1430/kwm

## 2013-03-28 ENCOUNTER — Ambulatory Visit (INDEPENDENT_AMBULATORY_CARE_PROVIDER_SITE_OTHER): Payer: Private Health Insurance - Indemnity | Admitting: *Deleted

## 2013-03-28 DIAGNOSIS — I420 Dilated cardiomyopathy: Secondary | ICD-10-CM

## 2013-03-28 DIAGNOSIS — I428 Other cardiomyopathies: Secondary | ICD-10-CM

## 2013-03-28 DIAGNOSIS — I5022 Chronic systolic (congestive) heart failure: Secondary | ICD-10-CM

## 2013-03-28 LAB — MDC_IDC_ENUM_SESS_TYPE_INCLINIC
Battery Remaining Longevity: 122 mo
Brady Statistic AP VP Percent: 0.01 %
Brady Statistic AP VS Percent: 10.36 %
Brady Statistic AS VS Percent: 89.6 %
Brady Statistic RV Percent Paced: 0.04 %
HighPow Impedance: 247 Ohm
HighPow Impedance: 58 Ohm
Lead Channel Impedance Value: 494 Ohm
Lead Channel Impedance Value: 532 Ohm
Lead Channel Pacing Threshold Pulse Width: 0.4 ms
Lead Channel Pacing Threshold Pulse Width: 0.4 ms
Lead Channel Sensing Intrinsic Amplitude: 3.125 mV
Lead Channel Setting Pacing Amplitude: 2 V
Lead Channel Setting Pacing Amplitude: 2.5 V
Lead Channel Setting Pacing Pulse Width: 0.4 ms
Lead Channel Setting Sensing Sensitivity: 0.3 mV
Zone Setting Detection Interval: 320 ms
Zone Setting Detection Interval: 400 ms

## 2013-03-28 NOTE — Progress Notes (Signed)
ICD check in clinic. Normal device function. Thresholds and sensing consistent with previous device measurements. Impedance trends stable over time. No evidence of any ventricular arrhythmias. No mode switches. Histogram distribution appropriate for patient and level of activity. No changes made this session. Device programmed at appropriate safety margins. Device programmed to optimize intrinsic conduction.  Optivol and thoracic impedance abnormal 11/13-12/2.   Estimated longevity 10.1 years.  Patient education completed including shock plan. Alert tones/vibration demonstrated for patient.  ROV with the device clinic in March.

## 2013-04-21 ENCOUNTER — Ambulatory Visit: Payer: Private Health Insurance - Indemnity | Admitting: Internal Medicine

## 2013-04-25 ENCOUNTER — Other Ambulatory Visit: Payer: Self-pay | Admitting: Internal Medicine

## 2013-04-25 ENCOUNTER — Ambulatory Visit: Payer: Managed Care, Other (non HMO)

## 2013-04-25 DIAGNOSIS — Z8619 Personal history of other infectious and parasitic diseases: Secondary | ICD-10-CM

## 2013-04-26 LAB — HELICOBACTER PYLORI  SPECIAL ANTIGEN: H. PYLORI ANTIGEN STOOL: NEGATIVE

## 2013-04-27 ENCOUNTER — Encounter: Payer: Self-pay | Admitting: Internal Medicine

## 2013-05-28 ENCOUNTER — Ambulatory Visit (INDEPENDENT_AMBULATORY_CARE_PROVIDER_SITE_OTHER): Payer: Private Health Insurance - Indemnity | Admitting: Internal Medicine

## 2013-05-28 ENCOUNTER — Encounter: Payer: Self-pay | Admitting: Internal Medicine

## 2013-05-28 ENCOUNTER — Other Ambulatory Visit (INDEPENDENT_AMBULATORY_CARE_PROVIDER_SITE_OTHER): Payer: Private Health Insurance - Indemnity

## 2013-05-28 VITALS — BP 162/94 | HR 80 | Temp 98.5°F | Resp 16 | Wt 269.0 lb

## 2013-05-28 DIAGNOSIS — E559 Vitamin D deficiency, unspecified: Secondary | ICD-10-CM

## 2013-05-28 DIAGNOSIS — F329 Major depressive disorder, single episode, unspecified: Secondary | ICD-10-CM

## 2013-05-28 DIAGNOSIS — M797 Fibromyalgia: Secondary | ICD-10-CM

## 2013-05-28 DIAGNOSIS — F32A Depression, unspecified: Secondary | ICD-10-CM

## 2013-05-28 DIAGNOSIS — IMO0001 Reserved for inherently not codable concepts without codable children: Secondary | ICD-10-CM

## 2013-05-28 DIAGNOSIS — K219 Gastro-esophageal reflux disease without esophagitis: Secondary | ICD-10-CM

## 2013-05-28 DIAGNOSIS — Z23 Encounter for immunization: Secondary | ICD-10-CM

## 2013-05-28 DIAGNOSIS — F3289 Other specified depressive episodes: Secondary | ICD-10-CM

## 2013-05-28 DIAGNOSIS — I5022 Chronic systolic (congestive) heart failure: Secondary | ICD-10-CM

## 2013-05-28 LAB — BASIC METABOLIC PANEL
BUN: 19 mg/dL (ref 6–23)
CALCIUM: 8.9 mg/dL (ref 8.4–10.5)
CO2: 26 mEq/L (ref 19–32)
Chloride: 101 mEq/L (ref 96–112)
Creatinine, Ser: 0.9 mg/dL (ref 0.4–1.2)
GFR: 66.36 mL/min (ref >60.00)
Glucose, Bld: 103 mg/dL — ABNORMAL HIGH (ref 70–99)
Potassium: 4 mEq/L (ref 3.5–5.1)
SODIUM: 139 meq/L (ref 135–145)

## 2013-05-28 LAB — CBC WITH DIFFERENTIAL/PLATELET
Basophils Absolute: 0.1 10*3/uL (ref 0.0–0.1)
Basophils Relative: 0.5 % (ref 0.0–3.0)
Eosinophils Absolute: 0.2 10*3/uL (ref 0.0–0.7)
Eosinophils Relative: 2 % (ref 0.0–5.0)
HCT: 41.5 % (ref 36.0–46.0)
Hemoglobin: 13.6 g/dL (ref 12.0–15.0)
LYMPHS PCT: 20 % (ref 12.0–46.0)
Lymphs Abs: 2.1 10*3/uL (ref 0.7–4.0)
MCHC: 32.7 g/dL (ref 30.0–36.0)
MCV: 91.5 fl (ref 78.0–100.0)
Monocytes Absolute: 0.6 10*3/uL (ref 0.1–1.0)
Monocytes Relative: 5.6 % (ref 3.0–12.0)
NEUTROS ABS: 7.4 10*3/uL (ref 1.4–7.7)
Neutrophils Relative %: 71.9 % (ref 43.0–77.0)
Platelets: 224 10*3/uL (ref 150.0–400.0)
RBC: 4.54 Mil/uL (ref 3.87–5.11)
RDW: 13.5 % (ref 11.5–14.6)
WBC: 10.3 10*3/uL (ref 4.5–10.5)

## 2013-05-28 LAB — LIPID PANEL
Cholesterol: 168 mg/dL (ref 0–200)
HDL: 37.7 mg/dL — AB (ref >39.00)
LDL Cholesterol: 104 mg/dL — ABNORMAL HIGH (ref 0–99)
Total CHOL/HDL Ratio: 4
Triglycerides: 131 mg/dL (ref 0.0–149.0)
VLDL: 26.2 mg/dL (ref 0.0–40.0)

## 2013-05-28 LAB — HEPATIC FUNCTION PANEL
ALT: 18 U/L (ref 0–35)
AST: 17 U/L (ref 0–37)
Albumin: 3.7 g/dL (ref 3.5–5.2)
Alkaline Phosphatase: 37 U/L — ABNORMAL LOW (ref 39–117)
BILIRUBIN TOTAL: 0.5 mg/dL (ref 0.3–1.2)
Bilirubin, Direct: 0 mg/dL (ref 0.0–0.3)
Total Protein: 7.6 g/dL (ref 6.0–8.3)

## 2013-05-28 LAB — TSH: TSH: 0.81 u[IU]/mL (ref 0.35–5.50)

## 2013-05-28 NOTE — Progress Notes (Signed)
   Subjective:    HPI    F/u cardiomyopathy /hereditary - ICD since 2001 Dr Harrington Challenger; CHF, elev glucose, vit D def She has a h/o brain tumor F/u fibromyalgia sx's  x long time - not well controlled  BP is good at home  Wt Readings from Last 3 Encounters:  05/28/13 269 lb (122.018 kg)  03/20/13 274 lb 6.4 oz (124.467 kg)  03/16/13 272 lb (123.378 kg)   BP Readings from Last 3 Encounters:  05/28/13 162/94  03/20/13 130/70  03/16/13 142/80      Review of Systems  Constitutional: Negative for chills, activity change, appetite change, fatigue and unexpected weight change.  HENT: Negative for congestion, mouth sores and sinus pressure.   Eyes: Negative for visual disturbance.  Respiratory: Negative for cough and chest tightness.   Gastrointestinal: Negative for nausea and abdominal pain.  Genitourinary: Negative for frequency, difficulty urinating and vaginal pain.  Musculoskeletal: Negative for back pain and gait problem.  Skin: Negative for pallor and rash.  Neurological: Negative for dizziness, tremors, weakness, numbness and headaches.  Psychiatric/Behavioral: Negative for confusion and sleep disturbance.       Objective:   Physical Exam  Constitutional: She appears well-developed. No distress.  obese  HENT:  Head: Normocephalic.  Right Ear: External ear normal.  Left Ear: External ear normal.  Nose: Nose normal.  Mouth/Throat: Oropharynx is clear and moist.  Eyes: Conjunctivae are normal. Pupils are equal, round, and reactive to light. Right eye exhibits no discharge. Left eye exhibits no discharge.  Neck: Normal range of motion. Neck supple. No JVD present. No tracheal deviation present. No thyromegaly present.  Cardiovascular: Normal rate, regular rhythm and normal heart sounds.   Pulmonary/Chest: No stridor. No respiratory distress. She has no wheezes.  Abdominal: Soft. Bowel sounds are normal. She exhibits no distension and no mass. There is no tenderness. There  is no rebound and no guarding.  Musculoskeletal: She exhibits no edema and no tenderness.  Lymphadenopathy:    She has no cervical adenopathy.  Neurological: She displays normal reflexes. No cranial nerve deficit. She exhibits normal muscle tone. Coordination normal.  Skin: No rash noted. No erythema.  Psychiatric: She has a normal mood and affect. Her behavior is normal. Judgment and thought content normal.    Lab Results  Component Value Date   WBC 11.5* 02/01/2013   HGB 14.7 02/01/2013   HCT 43.0 02/01/2013   PLT 241.0 02/01/2013   GLUCOSE 130* 02/01/2013   CHOL 165 10/20/2012   TRIG 127.0 10/20/2012   HDL 35.80* 10/20/2012   LDLCALC 104* 10/20/2012   ALT 21 02/01/2013   AST 19 02/01/2013   NA 138 02/01/2013   K 4.7 02/01/2013   CL 98 02/01/2013   CREATININE 1.0 02/01/2013   BUN 19 02/01/2013   CO2 31 02/01/2013   TSH 0.95 04/24/2012   HGBA1C 6.2 05/01/2012    Chart reviewed      Assessment & Plan:

## 2013-05-28 NOTE — Patient Instructions (Signed)
Gluten free trial (no wheat products) for 4-6 weeks. OK to use gluten-free bread and gluten-free pasta.  Milk free trial (no milk, ice cream, cheese and yogurt) for 4-6 weeks. OK to use almond, coconut, rice or soy milk. "Almond breeze" brand tastes good.  

## 2013-05-28 NOTE — Assessment & Plan Note (Signed)
Continue with current prescription therapy as reflected on the Med list. Labs Prevnar

## 2013-05-28 NOTE — Assessment & Plan Note (Signed)
Continue with current prescription therapy as reflected on the Med list.  

## 2013-05-28 NOTE — Assessment & Plan Note (Signed)
Gluten free trial (no wheat products) for 4-6 weeks. OK to use gluten-free bread and gluten-free pasta.  Milk free trial (no milk, ice cream, cheese and yogurt) for 4-6 weeks. OK to use almond, coconut, rice or soy milk. "Almond breeze" brand tastes good.  Continue with current prescription therapy as reflected on the Med list.

## 2013-05-28 NOTE — Progress Notes (Signed)
Pre visit review using our clinic review tool, if applicable. No additional management support is needed unless otherwise documented below in the visit note. 

## 2013-05-29 ENCOUNTER — Encounter: Payer: Self-pay | Admitting: Internal Medicine

## 2013-06-01 ENCOUNTER — Telehealth: Payer: Self-pay | Admitting: Internal Medicine

## 2013-06-01 NOTE — Telephone Encounter (Signed)
New Problem:  Pt states she is going through some extreme marriage issues.. States her ejection fraction was messed up.. States she gets SOB a lot. Pt is wanting to be seen today. Pt states she is not SOB now.Marland KitchenMarland Kitchen

## 2013-06-01 NOTE — Telephone Encounter (Signed)
Pt calls today b/c she & her husband have "hit a rough patch" & she feels like her EF has plummeted more since last year. She was short of breath yesterday. She is not short of breath today. She does want to have an ECHO to see how her heart function is at this time.  She is aware Dr. Harrington Challenger is out of the office today & does accept appointment with Richardson Dopp on 06/05/13. She is not in acute distress. Reassurance given  Horton Chin RN

## 2013-06-05 ENCOUNTER — Telehealth: Payer: Self-pay | Admitting: Internal Medicine

## 2013-06-05 ENCOUNTER — Ambulatory Visit: Payer: Private Health Insurance - Indemnity | Admitting: Physician Assistant

## 2013-06-05 MED ORDER — ONDANSETRON 4 MG PO TBDP: 4.0000 mg | ORAL_TABLET | Freq: Four times a day (QID) | ORAL | Status: DC | PRN

## 2013-06-05 NOTE — Telephone Encounter (Signed)
Zofran ODT 4 mg every 6 hours when necessary nausea Call if worsening or fails to resolve

## 2013-06-05 NOTE — Telephone Encounter (Signed)
Patient with personal issues with her husband and a great deal of stress.  She is having increased number of BMsm not diarrhea,.  She believes it is all stress that is causing nausea.  She is requesting something for nausea.  Please advise

## 2013-06-05 NOTE — Telephone Encounter (Signed)
Left message for patient to call back  

## 2013-06-05 NOTE — Telephone Encounter (Signed)
Patient notified  She will call for worsening symptoms

## 2013-06-07 ENCOUNTER — Ambulatory Visit: Payer: Private Health Insurance - Indemnity | Admitting: Internal Medicine

## 2013-06-11 ENCOUNTER — Ambulatory Visit (INDEPENDENT_AMBULATORY_CARE_PROVIDER_SITE_OTHER): Payer: Private Health Insurance - Indemnity | Admitting: Internal Medicine

## 2013-06-11 ENCOUNTER — Encounter: Payer: Self-pay | Admitting: Internal Medicine

## 2013-06-11 VITALS — BP 154/86 | HR 67 | Ht 64.0 in | Wt 268.0 lb

## 2013-06-11 DIAGNOSIS — I5022 Chronic systolic (congestive) heart failure: Secondary | ICD-10-CM

## 2013-06-11 MED ORDER — AMLODIPINE BESYLATE 2.5 MG PO TABS: 2.5000 mg | ORAL_TABLET | Freq: Every day | ORAL | Status: DC

## 2013-06-11 NOTE — Progress Notes (Signed)
HPI She was diagnosed with CHF in 2000. LVEF was initially in 20s She has had a cardiac cath in 2009 which showd no CAD. Last echo LVEF was 45% on echo in Jan 2013 She has known LBBB (old, present by report in 1082) She has had 3 AICDs. First rplaced due to faulty lead in 2005. Last was put in in Fall 2013. (ERI)  She had never had a CHF exacerbation When I saw her earlier this winter her volume status looked good  After seeing her I recomm an echo This was done and showed an LVEF of 25 to 30%. She saw Beckie Salts in Sept  Plan was to not make any device changes    I saw the patient last fall in clinic Since then she has been under increased stress since X mas  Husband and she are having signf marital problems  Increased stress  She wants him to back off  Feels it is hurting her physical state Breathing is stable  No CP      Allergies  Allergen Reactions  . Snail Extract Nausea And Vomiting  . Codeine Nausea And Vomiting  . Morphine And Related Nausea And Vomiting  . Penicillins Rash    Current Outpatient Prescriptions  Medication Sig Dispense Refill  . carvedilol (COREG) 25 MG tablet Take 25-37.5 mg by mouth 2 (two) times daily with a meal. 2 po in the am (25mg ) and 1 1/2 in the pm (37.5mg )      . clobetasol ointment (TEMOVATE) 1.02 % Apply 1 application topically 2 (two) times daily as needed (rash). As needed      . desonide (DESOWEN) 0.05 % ointment Apply 1 application topically 2 (two) times daily as needed (rash). As needed      . DULoxetine (CYMBALTA) 60 MG capsule Take 60 mg by mouth daily.      . fluconazole (DIFLUCAN) 150 MG tablet Take 150 mg by mouth as needed. Take one tablet po      . fluocinonide ointment (LIDEX) 5.85 % Apply 1 application topically 2 (two) times daily as needed (rash). Solution as needed      . furosemide (LASIX) 20 MG tablet Take 20-40 mg by mouth daily.      . Ibuprofen-Diphenhydramine Cit (IBUPROFEN PM) 200-38 MG TABS Take 2 tablets by mouth at bedtime as  needed (sleep and pain).      Marland Kitchen losartan (COZAAR) 100 MG tablet Take 1 tablet (100 mg total) by mouth daily.  30 tablet  6  . metroNIDAZOLE (FLAGYL) 500 MG tablet Take 500 mg by mouth 3 (three) times daily as needed.       . naproxen sodium (ANAPROX) 220 MG tablet Take 220 mg by mouth 2 (two) times daily as needed. For pain.      Marland Kitchen neomycin-polymyxin-hydrocortisone (CORTISPORIN) otic solution Place 4 drops into the right ear 4 (four) times daily. For 10 days  10 mL  0  . ondansetron (ZOFRAN ODT) 4 MG disintegrating tablet Take 1 tablet (4 mg total) by mouth every 6 (six) hours as needed for nausea or vomiting.  30 tablet  0  . pantoprazole (PROTONIX) 40 MG tablet Take 1 tablet (40 mg total) by mouth daily.  90 tablet  3  . spironolactone (ALDACTONE) 25 MG tablet Take 50 mg by mouth daily.       . traMADol (ULTRAM) 50 MG tablet Take 50 mg by mouth every 6 (six) hours as needed for pain.  No current facility-administered medications for this visit.    Past Medical History  Diagnosis Date  . Cardiomyopathy   . ICD (implantable cardiac defibrillator) in place     Medtronic ICD  . Fibromyalgia   . CHF (congestive heart failure)   . Heart murmur   . Hypertension   . Depression   . Diverticulosis   . Gallstones   . IBS (irritable bowel syndrome)   . Complication of anesthesia     12'82-Cardiac Arrest in OR"positional LBBB"  . History of Helicobacter pylori infection   . Gastritis     Past Surgical History  Procedure Laterality Date  . Abdominal hysterectomy    . Colectomy  2001    sigmoid  . Cholecystectomy      '82  . Cardiac catheterization  1983    last 2010,-[place]  . Brain surgery  2004    "meningioma"Tumor-retained Titanium plate to cover skull  . Icd implanted Left     Medtronic  . Esophagogastroduodenoscopy (egd) with propofol N/A 02/13/2013    Procedure: ESOPHAGOGASTRODUODENOSCOPY (EGD) WITH PROPOFOL;  Surgeon: Jerene Bears, MD;  Location: [facility];   Service: Gastroenterology;  Laterality: N/A;  . Colonoscopy with propofol N/A 02/13/2013    Procedure: COLONOSCOPY WITH PROPOFOL;  Surgeon: Jerene Bears, MD;  Location: [facility];  Service: Gastroenterology;  Laterality: N/A;    Family History  Problem Relation Age of Onset  . Diabetes Mother   . Diabetes Maternal Grandmother   . Heart disease Mother   . Heart disease Father   . Heart disease Paternal Grandfather   . Heart disease Paternal Grandmother   . Heart disease Maternal Grandmother   . Lung cancer Paternal Grandmother   . Colon cancer Neg Hx     History   Social History  . Marital Status: Married    Spouse Name: N/A    Number of Children: 2  . Years of Education: N/A   Occupational History  . retired    Social History Main Topics  . Smoking status: Never Smoker   . Smokeless tobacco: Never Used  . Alcohol Use: Yes     Comment: social- wine with dinner  . Drug Use: No  . Sexual Activity: Not on file   Other Topics Concern  . Not on file   Social History Narrative  . No narrative on file    Review of Systems:  All systems reviewed.  They are negative to the above problem except as previously stated.  Vital Signs: BP 154/86  Pulse 67  Ht 5\' 4"  (1.626 m)  Wt 268 lb (121.564 kg)  BMI 45.98 kg/m2 Physical Exam Morbidly obese 58 yo  Tearful at [employer]   HEENT:  Normocephalic, atraumatic. EOMI, PERRLA.  Neck: JVP is normal.  No bruits.  Lungs: clear to auscultation. No rales no wheezes.  Heart: Regular rate and rhythm. Normal S1, S2. No S3.   No significant murmurs. PMI not displaced.  Abdomen:  Supple, nontender. Normal bowel sounds. No masses. No hepatomegaly.  Extremities:   Good distal pulses throughout. No lower extremity edema.  Musculoskeletal :moving all extremities.  Neuro:   alert and oriented x3.  CN II-XII grossly intact.  EKG  Atrial paced  67 bpm  LBB Assessment and Plan:  1.  Chronic systolic CHF  Volume status is OK   2.  HTN  BP is  severely elevated  She is under increased stress  I would recomm adding 2.5 mg amlodipine to regimen.  F/U in 4 wks 3.  Psych  Patient under marked increased stress  Recomm counselling

## 2013-06-11 NOTE — Patient Instructions (Signed)
Your physician has recommended you make the following change in your medication:   1. Start Amlodipine 2.5 mg daily.   Your physician recommends that you schedule a follow-up appointment in: 4 weeks with Dr. Dorris Carnes.

## 2013-06-13 ENCOUNTER — Ambulatory Visit: Payer: Private Health Insurance - Indemnity | Admitting: Physician Assistant

## 2013-06-27 ENCOUNTER — Ambulatory Visit (INDEPENDENT_AMBULATORY_CARE_PROVIDER_SITE_OTHER): Payer: Private Health Insurance - Indemnity | Admitting: *Deleted

## 2013-06-27 ENCOUNTER — Encounter: Payer: Self-pay | Admitting: Internal Medicine

## 2013-06-27 DIAGNOSIS — I420 Dilated cardiomyopathy: Secondary | ICD-10-CM

## 2013-06-27 DIAGNOSIS — I5022 Chronic systolic (congestive) heart failure: Secondary | ICD-10-CM

## 2013-06-27 DIAGNOSIS — I447 Left bundle-branch block, unspecified: Secondary | ICD-10-CM

## 2013-06-27 DIAGNOSIS — I428 Other cardiomyopathies: Secondary | ICD-10-CM

## 2013-06-27 LAB — MDC_IDC_ENUM_SESS_TYPE_INCLINIC
Battery Voltage: 3.03 V
Brady Statistic AP VP Percent: 0 %
Brady Statistic AP VS Percent: 11.95 %
Brady Statistic AS VP Percent: 0.03 %
Brady Statistic RA Percent Paced: 11.95 %
Brady Statistic RV Percent Paced: 0.04 %
Date Time Interrogation Session: 20150318171204
HIGH POWER IMPEDANCE MEASURED VALUE: 247 Ohm
HIGH POWER IMPEDANCE MEASURED VALUE: 61 Ohm
HighPow Impedance: 75 Ohm
Lead Channel Impedance Value: 570 Ohm
Lead Channel Pacing Threshold Amplitude: 0.5 V
Lead Channel Pacing Threshold Pulse Width: 0.4 ms
Lead Channel Pacing Threshold Pulse Width: 0.4 ms
Lead Channel Sensing Intrinsic Amplitude: 19.125 mV
Lead Channel Sensing Intrinsic Amplitude: 2.5 mV
Lead Channel Sensing Intrinsic Amplitude: 25.5 mV
Lead Channel Setting Pacing Amplitude: 2 V
Lead Channel Setting Pacing Amplitude: 2.5 V
Lead Channel Setting Pacing Pulse Width: 0.4 ms
MDC IDC MSMT BATTERY REMAINING LONGEVITY: 119 mo
MDC IDC MSMT LEADCHNL RA IMPEDANCE VALUE: 532 Ohm
MDC IDC MSMT LEADCHNL RA PACING THRESHOLD AMPLITUDE: 1 V
MDC IDC MSMT LEADCHNL RA SENSING INTR AMPL: 2.75 mV
MDC IDC SET LEADCHNL RV SENSING SENSITIVITY: 0.3 mV
MDC IDC SET ZONE DETECTION INTERVAL: 270 ms
MDC IDC SET ZONE DETECTION INTERVAL: 320 ms
MDC IDC SET ZONE DETECTION INTERVAL: 400 ms
MDC IDC STAT BRADY AS VS PERCENT: 88.02 %
Zone Setting Detection Interval: 350 ms
Zone Setting Detection Interval: 360 ms

## 2013-06-29 NOTE — Progress Notes (Signed)
ICD check in clinic. Normal device function. Thresholds and sensing consistent with previous device measurements. Impedance trends stable over time. No evidence of any ventricular arrhythmias. No mode switches. Histogram distribution appropriate for patient and level of activity. No changes made this session. Device programmed at appropriate safety margins. Device programmed to optimize intrinsic conduction. Estimated longevity 9.9 years. Pt will f/u with the device clinic in 3 months and with [facility] in September 2015. Patient education completed including shock plan. Alert tones demonstrated for patient.

## 2013-07-06 ENCOUNTER — Ambulatory Visit (INDEPENDENT_AMBULATORY_CARE_PROVIDER_SITE_OTHER): Payer: Private Health Insurance - Indemnity | Admitting: Internal Medicine

## 2013-07-06 ENCOUNTER — Encounter: Payer: Self-pay | Admitting: Internal Medicine

## 2013-07-06 VITALS — BP 146/102 | HR 72 | Ht 65.0 in | Wt 260.0 lb

## 2013-07-06 DIAGNOSIS — I10 Essential (primary) hypertension: Secondary | ICD-10-CM

## 2013-07-06 DIAGNOSIS — I509 Heart failure, unspecified: Secondary | ICD-10-CM

## 2013-07-06 DIAGNOSIS — I5022 Chronic systolic (congestive) heart failure: Secondary | ICD-10-CM

## 2013-07-06 MED ORDER — NIFEDIPINE ER OSMOTIC RELEASE 30 MG PO TB24
30.0000 mg | ORAL_TABLET | Freq: Two times a day (BID) | ORAL | Status: DC
Start: 2013-07-06 — End: 2014-07-15

## 2013-07-06 NOTE — Patient Instructions (Signed)
Your physician recommends that you schedule a follow-up appointment in: 6 WEEKS WITH DR ROSS  STOP AMLODIPINE  START NIFEDIPINE 30 MG ONE TABLET TWICE DAILY

## 2013-07-06 NOTE — Progress Notes (Signed)
HPI She was diagnosed with CHF in 2000. LVEF was initially in 20s She has had a cardiac cath in 2009 which showd no CAD. Last echo LVEF was 45% on echo in Jan 2013 She has known LBBB (old, present by report in 1082) She has had 3 AICDs. First rplaced due to faulty lead in 2005. Last was put in in Fall 2013. (ERI)  She had never had a CHF exacerbation When I saw her earlier this winter her volume status looked good  After seeing her I recomm an echo This was done and showed an LVEF of 25 to 30%. She saw Beckie Salts in Sept  Plan was to not make any device changes     No CP    I saw her 1 month ago  BP was high  Under increased stress I recomm amlodipine  2.5  BP is still high at times.  Some LE edema, worse    Allergies  Allergen Reactions  . Snail Extract Nausea And Vomiting  . Codeine Nausea And Vomiting  . Morphine And Related Nausea And Vomiting  . Penicillins Rash    Current Outpatient Prescriptions  Medication Sig Dispense Refill  . amLODipine (NORVASC) 2.5 MG tablet Take 1 tablet (2.5 mg total) by mouth daily.  30 tablet  6  . carvedilol (COREG) 25 MG tablet Take 25-37.5 mg by mouth 2 (two) times daily with a meal. 2 po in the am (25mg ) and 1 1/2 in the pm (37.5mg )      . clobetasol ointment (TEMOVATE) 0.93 % Apply 1 application topically 2 (two) times daily as needed (rash). As needed      . desonide (DESOWEN) 0.05 % ointment Apply 1 application topically 2 (two) times daily as needed (rash). As needed      . DULoxetine (CYMBALTA) 60 MG capsule Take 60 mg by mouth daily. PT TAKES NAME BRAND ONLY FOR CYMBALTA      . fluconazole (DIFLUCAN) 150 MG tablet Take 150 mg by mouth as needed. Take one tablet po      . fluocinonide ointment (LIDEX) 8.18 % Apply 1 application topically 2 (two) times daily as needed (rash). Solution as needed      . furosemide (LASIX) 20 MG tablet Take 20-40 mg by mouth daily.      . Ibuprofen-Diphenhydramine Cit (IBUPROFEN PM) 200-38 MG TABS Take 2 tablets by  mouth at bedtime as needed (sleep and pain).      Marland Kitchen losartan (COZAAR) 100 MG tablet Take 1 tablet (100 mg total) by mouth daily.  30 tablet  6  . metroNIDAZOLE (FLAGYL) 500 MG tablet Take 500 mg by mouth 3 (three) times daily as needed.       . naproxen sodium (ANAPROX) 220 MG tablet Take 220 mg by mouth 2 (two) times daily as needed. For pain.      Marland Kitchen ondansetron (ZOFRAN ODT) 4 MG disintegrating tablet Take 1 tablet (4 mg total) by mouth every 6 (six) hours as needed for nausea or vomiting.  30 tablet  0  . pantoprazole (PROTONIX) 40 MG tablet Take 1 tablet (40 mg total) by mouth daily.  90 tablet  3  . spironolactone (ALDACTONE) 25 MG tablet Take 50 mg by mouth daily.       . traMADol (ULTRAM) 50 MG tablet Take 50 mg by mouth every 6 (six) hours as needed for pain.       No current facility-administered medications for this visit.    Past Medical  History  Diagnosis Date  . Cardiomyopathy   . ICD (implantable cardiac defibrillator) in place     Medtronic ICD  . Fibromyalgia   . CHF (congestive heart failure)   . Heart murmur   . Hypertension   . Depression   . Diverticulosis   . Gallstones   . IBS (irritable bowel syndrome)   . Complication of anesthesia     12'82-Cardiac Arrest in OR"positional LBBB"  . History of Helicobacter pylori infection   . Gastritis     Past Surgical History  Procedure Laterality Date  . Abdominal hysterectomy    . Colectomy  2001    sigmoid  . Cholecystectomy      '82  . Cardiac catheterization  1983    last 2010,-[place]  . Brain surgery  2004    "meningioma"Tumor-retained Titanium plate to cover skull  . Icd implanted Left     Medtronic  . Esophagogastroduodenoscopy (egd) with propofol N/A 02/13/2013    Procedure: ESOPHAGOGASTRODUODENOSCOPY (EGD) WITH PROPOFOL;  Surgeon: Jerene Bears, MD;  Location: [facility];  Service: Gastroenterology;  Laterality: N/A;  . Colonoscopy with propofol N/A 02/13/2013    Procedure: COLONOSCOPY WITH PROPOFOL;   Surgeon: Jerene Bears, MD;  Location: [facility];  Service: Gastroenterology;  Laterality: N/A;    Family History  Problem Relation Age of Onset  . Diabetes Mother   . Diabetes Maternal Grandmother   . Heart disease Mother   . Heart disease Father   . Heart disease Paternal Grandfather   . Heart disease Paternal Grandmother   . Heart disease Maternal Grandmother   . Lung cancer Paternal Grandmother   . Colon cancer Neg Hx     History   Social History  . Marital Status: Married    Spouse Name: N/A    Number of Children: 2  . Years of Education: N/A   Occupational History  . retired    Social History Main Topics  . Smoking status: Never Smoker   . Smokeless tobacco: Never Used  . Alcohol Use: Yes     Comment: social- wine with dinner  . Drug Use: No  . Sexual Activity: Not on file   Other Topics Concern  . Not on file   Social History Narrative  . No narrative on file    Review of Systems:  All systems reviewed.  They are negative to the above problem except as previously stated.  Vital Signs: BP 146/102  Pulse 72  Ht 5\' 5"  (1.651 m)  Wt 260 lb (117.935 kg)  BMI 43.27 kg/m2 Physical Exam Morbidly obese 58 yo  Tearful at [employer]   HEENT:  Normocephalic, atraumatic. EOMI, PERRLA.  Neck: JVP is normal.  No bruits.  Lungs: clear to auscultation. No rales no wheezes.  Heart: Regular rate and rhythm. Normal S1, S2. No S3.   No significant murmurs. PMI not displaced.  Abdomen:  Supple, nontender. Normal bowel sounds. No masses. No hepatomegaly.  Extremities:   Good distal pulses throughout. Tr to 1+ lower extremity edema.  Musculoskeletal :moving all extremities.  Neuro:   alert and oriented x3.  CN II-XII grossly intact.  EKG  Atrial paced  67 bpm  LBB Assessment and Plan:  1.  Chronic systolic CHF  Volume status is OK   Legs swollen prob from amlodipine  2.  HTN  BP is still elevated but not as bad  But edema  WIll switch to procardia XL  30 bid   3.  Psych  Patient under marked increased stress  Recomm counselling

## 2013-08-17 ENCOUNTER — Ambulatory Visit: Payer: Private Health Insurance - Indemnity | Admitting: Internal Medicine

## 2013-08-24 ENCOUNTER — Telehealth: Payer: Self-pay | Admitting: Internal Medicine

## 2013-08-28 MED ORDER — CIPROFLOXACIN HCL 500 MG PO TABS
500.0000 mg | ORAL_TABLET | Freq: Two times a day (BID) | ORAL | Status: DC
Start: 2013-08-28 — End: 2013-12-12

## 2013-08-28 MED ORDER — METRONIDAZOLE 500 MG PO TABS: 500.0000 mg | ORAL_TABLET | Freq: Three times a day (TID) | ORAL | Status: DC

## 2013-08-28 NOTE — Telephone Encounter (Signed)
Patient does have a history of diverticulitis and still has diverticulosis despite having had colon surgery With her traveling, with abdominal pain and her history okay to refill Cipro 500 mg twice daily and Flagyl 500 mg 3 times daily x7 days She should followup with me or primary care when she returns

## 2013-08-28 NOTE — Telephone Encounter (Signed)
Sent in Cipro and Flagyl to pts pharmacy in [place]. Pharmacy to notify pt when ready

## 2013-10-16 ENCOUNTER — Encounter: Payer: Self-pay | Admitting: Internal Medicine

## 2013-10-17 ENCOUNTER — Other Ambulatory Visit: Payer: Self-pay | Admitting: Internal Medicine

## 2013-10-19 ENCOUNTER — Ambulatory Visit (INDEPENDENT_AMBULATORY_CARE_PROVIDER_SITE_OTHER): Payer: Managed Care, Other (non HMO) | Admitting: Family Medicine

## 2013-10-19 ENCOUNTER — Encounter: Payer: Self-pay | Admitting: Family Medicine

## 2013-10-19 VITALS — BP 122/76 | HR 66 | Temp 98.8°F | Ht 65.0 in | Wt 266.5 lb

## 2013-10-19 DIAGNOSIS — J069 Acute upper respiratory infection, unspecified: Secondary | ICD-10-CM

## 2013-10-19 DIAGNOSIS — J029 Acute pharyngitis, unspecified: Secondary | ICD-10-CM

## 2013-10-19 DIAGNOSIS — M545 Low back pain, unspecified: Secondary | ICD-10-CM

## 2013-10-19 NOTE — Patient Instructions (Addendum)
-  afrin for 4 days  -gargle salt water  -heat, exercises provided and tylenol as instructed for back  -follow up with you primary care doctor in a few weeks as scheduled or sooner if needed

## 2013-10-19 NOTE — Progress Notes (Signed)
No chief complaint on file.   HPI:  1) Sore throat: -started: 2 days ago -symptoms: sore throat and drainage and glands feel swollen in neck -denies:fever, SOB, NVD, tooth pain, sinus pain or ear pain -has tried: nothing -sick contacts/travel/risks: denies flu exposure, tick exposure or or Ebola risks -Hx of: allergies  2)back pain: -has fibro, intermittent spasm in muscle in L low back the last week or so after uncomfortable car ride -denies: radiation, dysuria, hematuria, weakness or numbness, fevers  ROS: See pertinent positives and negatives per HPI.  Past Medical History  Diagnosis Date  . Cardiomyopathy   . ICD (implantable cardiac defibrillator) in place     Medtronic ICD  . Fibromyalgia   . CHF (congestive heart failure)   . Heart murmur   . Hypertension   . Depression   . Diverticulosis   . Gallstones   . IBS (irritable bowel syndrome)   . Complication of anesthesia     12'82-Cardiac Arrest in OR"positional LBBB"  . History of Helicobacter pylori infection   . Gastritis     Past Surgical History  Procedure Laterality Date  . Abdominal hysterectomy    . Colectomy  2001    sigmoid  . Cholecystectomy      '82  . Cardiac catheterization  1983    last 2010,-[place]  . Brain surgery  2004    "meningioma"Tumor-retained Titanium plate to cover skull  . Icd implanted Left     Medtronic  . Esophagogastroduodenoscopy (egd) with propofol N/A 02/13/2013    Procedure: ESOPHAGOGASTRODUODENOSCOPY (EGD) WITH PROPOFOL;  Surgeon: Jerene Bears, MD;  Location: [facility];  Service: Gastroenterology;  Laterality: N/A;  . Colonoscopy with propofol N/A 02/13/2013    Procedure: COLONOSCOPY WITH PROPOFOL;  Surgeon: Jerene Bears, MD;  Location: [facility];  Service: Gastroenterology;  Laterality: N/A;    Family History  Problem Relation Age of Onset  . Diabetes Mother   . Diabetes Maternal Grandmother   . Heart disease Mother   . Heart disease Father   . Heart disease  Paternal Grandfather   . Heart disease Paternal Grandmother   . Heart disease Maternal Grandmother   . Lung cancer Paternal Grandmother   . Colon cancer Neg Hx     History   Social History  . Marital Status: Married    Spouse Name: N/A    Number of Children: 2  . Years of Education: N/A   Occupational History  . retired    Social History Main Topics  . Smoking status: Never Smoker   . Smokeless tobacco: Never Used  . Alcohol Use: Yes     Comment: social- wine with dinner  . Drug Use: No  . Sexual Activity: None   Other Topics Concern  . None   Social History Narrative  . None    Current outpatient prescriptions:carvedilol (COREG) 25 MG tablet, Take 25-37.5 mg by mouth 2 (two) times daily with a meal. 2 po in the am (25mg ) and 1 1/2 in the pm (37.5mg ), Disp: , Rfl: ;  ciprofloxacin (CIPRO) 500 MG tablet, Take 1 tablet (500 mg total) by mouth 2 (two) times daily., Disp: 14 tablet, Rfl: 0;  clobetasol ointment (TEMOVATE) 1.51 %, Apply 1 application topically 2 (two) times daily as needed (rash). As needed, Disp: , Rfl:  desonide (DESOWEN) 0.05 % ointment, Apply 1 application topically 2 (two) times daily as needed (rash). As needed, Disp: , Rfl: ;  DULoxetine (CYMBALTA) 60 MG capsule, Take 60 mg  by mouth daily. PT TAKES NAME BRAND ONLY FOR CYMBALTA, Disp: , Rfl: ;  fluconazole (DIFLUCAN) 150 MG tablet, Take 150 mg by mouth as needed. Take one tablet po, Disp: , Rfl:  fluocinonide ointment (LIDEX) 1.91 %, Apply 1 application topically 2 (two) times daily as needed (rash). Solution as needed, Disp: , Rfl: ;  furosemide (LASIX) 20 MG tablet, Take 20-40 mg by mouth daily., Disp: , Rfl: ;  losartan (COZAAR) 100 MG tablet, TAKE 1 TABLET BY MOUTH EVERY DAY, Disp: 30 tablet, Rfl: 0;  metroNIDAZOLE (FLAGYL) 500 MG tablet, Take 1 tablet (500 mg total) by mouth 3 (three) times daily., Disp: 21 tablet, Rfl: 0 naproxen sodium (ANAPROX) 220 MG tablet, Take 220 mg by mouth 2 (two) times daily as  needed. For pain., Disp: , Rfl: ;  NIFEdipine (PROCARDIA-XL/ADALAT-CC/NIFEDICAL-XL) 30 MG 24 hr tablet, Take 1 tablet (30 mg total) by mouth 2 (two) times daily., Disp: 60 tablet, Rfl: 12;  ondansetron (ZOFRAN ODT) 4 MG disintegrating tablet, Take 1 tablet (4 mg total) by mouth every 6 (six) hours as needed for nausea or vomiting., Disp: 30 tablet, Rfl: 0 pantoprazole (PROTONIX) 40 MG tablet, Take 1 tablet (40 mg total) by mouth daily., Disp: 90 tablet, Rfl: 3;  spironolactone (ALDACTONE) 25 MG tablet, Take 50 mg by mouth daily. , Disp: , Rfl: ;  traMADol (ULTRAM) 50 MG tablet, Take 50 mg by mouth every 6 (six) hours as needed for pain., Disp: , Rfl:   EXAM:  Filed Vitals:   10/19/13 1448  BP: 122/76  Pulse: 66  Temp: 98.8 F (37.1 C)    Body mass index is 44.35 kg/(m^2).  GENERAL: vitals reviewed and listed above, alert, oriented, appears well hydrated and in no acute distress  HEENT: atraumatic, conjunttiva clear, no obvious abnormalities on inspection of external nose and ears, normal appearance of ear canals and TMs, clear nasal congestion, mild post oropharyngeal erythema with PND, no tonsillar edema or exudate, no sinus TTP  NECK: no obvious masses on inspection  LUNGS: clear to auscultation bilaterally, no wheezes, rales or rhonchi, good air movement  CV: HRRR, no peripheral edema  MS: moves all extremities without noticeable abnormality, gait normal, TTP in L QL muscle, NV intact LE  PSYCH: pleasant and cooperative, no obvious depression or anxiety  ASSESSMENT AND PLAN:  Discussed the following assessment and plan:  Sore throat  Upper respiratory infection  Left-sided low back pain without sciatica  -given HPI and exam findings today, a serious infection or illness is unlikely. We discussed potential etiologies, with VURI being most likely, and advised supportive care and monitoring. We discussed treatment side effects, likely course, antibiotic misuse, transmission,  and signs of developing a serious illness. -muscle strain - hep, heat, conservative care and follow up with PCP -of course, we advised to return or notify a doctor immediately if symptoms worsen or persist or new concerns arise.    Patient Instructions  -afrin for 4 days  -gargle salt water  -heat, exercises provided and tylenol as instructed for back  -see your cardiologist as scheduled  -follow up with you primary care doctor in a few weeks as scheduled or sooner if needed     KIM, HANNAH R.

## 2013-10-19 NOTE — Progress Notes (Signed)
Pre visit review using our clinic review tool, if applicable. No additional management support is needed unless otherwise documented below in the visit note. 

## 2013-11-11 ENCOUNTER — Other Ambulatory Visit: Payer: Self-pay | Admitting: Internal Medicine

## 2013-11-12 ENCOUNTER — Encounter: Payer: Private Health Insurance - Indemnity | Admitting: Internal Medicine

## 2013-11-12 NOTE — Progress Notes (Signed)
HPI She was diagnosed with CHF in 2000. LVEF was initially in 20s She has had a cardiac cath in 2009 which showd no CAD. Last echo LVEF was 45% on echo in Jan 2013 She has known LBBB (old, present by report in 1082) She has had 3 AICDs. First rplaced due to faulty lead in 2005. Last was put in in Fall 2013. (ERI)  She had never had a CHF exacerbation When I saw her earlier this winter her volume status looked good  After seeing her I recomm an echo This was done and showed an LVEF of 25 to 30%. She saw Beckie Salts in Sept  Plan was to not make any device changes    I saw her earier this year  BP was high  I added amlodipine  Edema increased some  I saw her later in March  Allergies  Allergen Reactions  . Snail Extract Nausea And Vomiting  . Codeine Nausea And Vomiting  . Morphine And Related Nausea And Vomiting  . Penicillins Rash    Current Outpatient Prescriptions  Medication Sig Dispense Refill  . carvedilol (COREG) 25 MG tablet Take 25-37.5 mg by mouth 2 (two) times daily with a meal. 2 po in the am (25mg ) and 1 1/2 in the pm (37.5mg )      . ciprofloxacin (CIPRO) 500 MG tablet Take 1 tablet (500 mg total) by mouth 2 (two) times daily.  14 tablet  0  . clobetasol ointment (TEMOVATE) 5.95 % Apply 1 application topically 2 (two) times daily as needed (rash). As needed      . desonide (DESOWEN) 0.05 % ointment Apply 1 application topically 2 (two) times daily as needed (rash). As needed      . DULoxetine (CYMBALTA) 60 MG capsule Take 60 mg by mouth daily. PT TAKES NAME BRAND ONLY FOR CYMBALTA      . fluconazole (DIFLUCAN) 150 MG tablet Take 150 mg by mouth as needed. Take one tablet po      . fluocinonide ointment (LIDEX) 6.38 % Apply 1 application topically 2 (two) times daily as needed (rash). Solution as needed      . furosemide (LASIX) 20 MG tablet Take 20-40 mg by mouth daily.      Marland Kitchen losartan (COZAAR) 100 MG tablet TAKE 1 TABLET BY MOUTH EVERY DAY  30 tablet  0  . metroNIDAZOLE (FLAGYL)  500 MG tablet Take 1 tablet (500 mg total) by mouth 3 (three) times daily.  21 tablet  0  . naproxen sodium (ANAPROX) 220 MG tablet Take 220 mg by mouth 2 (two) times daily as needed. For pain.      Marland Kitchen NIFEdipine (PROCARDIA-XL/ADALAT-CC/NIFEDICAL-XL) 30 MG 24 hr tablet Take 1 tablet (30 mg total) by mouth 2 (two) times daily.  60 tablet  12  . ondansetron (ZOFRAN ODT) 4 MG disintegrating tablet Take 1 tablet (4 mg total) by mouth every 6 (six) hours as needed for nausea or vomiting.  30 tablet  0  . pantoprazole (PROTONIX) 40 MG tablet Take 1 tablet (40 mg total) by mouth daily.  90 tablet  3  . spironolactone (ALDACTONE) 25 MG tablet Take 50 mg by mouth daily.       . traMADol (ULTRAM) 50 MG tablet Take 50 mg by mouth every 6 (six) hours as needed for pain.       No current facility-administered medications for this visit.    Past Medical History  Diagnosis Date  . Cardiomyopathy   . ICD (  implantable cardiac defibrillator) in place     Medtronic ICD  . Fibromyalgia   . CHF (congestive heart failure)   . Heart murmur   . Hypertension   . Depression   . Diverticulosis   . Gallstones   . IBS (irritable bowel syndrome)   . Complication of anesthesia     12'82-Cardiac Arrest in OR"positional LBBB"  . History of Helicobacter pylori infection   . Gastritis     Past Surgical History  Procedure Laterality Date  . Abdominal hysterectomy    . Colectomy  2001    sigmoid  . Cholecystectomy      '82  . Cardiac catheterization  1983    last 2010,-[place]  . Brain surgery  2004    "meningioma"Tumor-retained Titanium plate to cover skull  . Icd implanted Left     Medtronic  . Esophagogastroduodenoscopy (egd) with propofol N/A 02/13/2013    Procedure: ESOPHAGOGASTRODUODENOSCOPY (EGD) WITH PROPOFOL;  Surgeon: Jerene Bears, MD;  Location: [facility];  Service: Gastroenterology;  Laterality: N/A;  . Colonoscopy with propofol N/A 02/13/2013    Procedure: COLONOSCOPY WITH PROPOFOL;  Surgeon:  Jerene Bears, MD;  Location: [facility];  Service: Gastroenterology;  Laterality: N/A;    Family History  Problem Relation Age of Onset  . Diabetes Mother   . Diabetes Maternal Grandmother   . Heart disease Mother   . Heart disease Father   . Heart disease Paternal Grandfather   . Heart disease Paternal Grandmother   . Heart disease Maternal Grandmother   . Lung cancer Paternal Grandmother   . Colon cancer Neg Hx     History   Social History  . Marital Status: Married    Spouse Name: N/A    Number of Children: 2  . Years of Education: N/A   Occupational History  . retired    Social History Main Topics  . Smoking status: Never Smoker   . Smokeless tobacco: Never Used  . Alcohol Use: Yes     Comment: social- wine with dinner  . Drug Use: No  . Sexual Activity: Not on file   Other Topics Concern  . Not on file   Social History Narrative  . No narrative on file    Review of Systems:  All systems reviewed.  They are negative to the above problem except as previously stated.  Vital Signs: There were no vitals taken for this visit. Physical Exam Morbidly obese 58 yo  Tearful at tmies   HEENT:  Normocephalic, atraumatic. EOMI, PERRLA.  Neck: JVP is normal.  No bruits.  Lungs: clear to auscultation. No rales no wheezes.  Heart: Regular rate and rhythm. Normal S1, S2. No S3.   No significant murmurs. PMI not displaced.  Abdomen:  Supple, nontender. Normal bowel sounds. No masses. No hepatomegaly.  Extremities:   Good distal pulses throughout. Tr to 1+ lower extremity edema.  Musculoskeletal :moving all extremities.  Neuro:   alert and oriented x3.  CN II-XII grossly intact.  EKG  Atrial paced  67 bpm  LBB Assessment and Plan:  1.  Chronic systolic CHF  Volume status is OK   Legs swollen prob from amlodipine  2.  HTN  BP is still elevated but not as bad  But edema  WIll switch to procardia XL  30 bid   3.  Psych  Patient under marked increased stress  Recomm  counselling     This encounter was created in error - please disregard.

## 2013-12-06 ENCOUNTER — Encounter: Payer: Managed Care, Other (non HMO) | Admitting: Physician Assistant

## 2013-12-06 ENCOUNTER — Ambulatory Visit: Payer: Private Health Insurance - Indemnity | Admitting: Internal Medicine

## 2013-12-07 ENCOUNTER — Ambulatory Visit: Payer: Self-pay | Admitting: Internal Medicine

## 2013-12-07 ENCOUNTER — Other Ambulatory Visit: Payer: Self-pay | Admitting: Internal Medicine

## 2013-12-11 ENCOUNTER — Ambulatory Visit (INDEPENDENT_AMBULATORY_CARE_PROVIDER_SITE_OTHER): Payer: Managed Care, Other (non HMO) | Admitting: *Deleted

## 2013-12-11 ENCOUNTER — Encounter: Payer: Self-pay | Admitting: Cardiology

## 2013-12-11 ENCOUNTER — Ambulatory Visit (INDEPENDENT_AMBULATORY_CARE_PROVIDER_SITE_OTHER): Payer: Managed Care, Other (non HMO) | Admitting: Cardiology

## 2013-12-11 VITALS — BP 152/80 | HR 60 | Ht 67.0 in | Wt 262.0 lb

## 2013-12-11 DIAGNOSIS — I428 Other cardiomyopathies: Secondary | ICD-10-CM

## 2013-12-11 DIAGNOSIS — R0609 Other forms of dyspnea: Secondary | ICD-10-CM | POA: Insufficient documentation

## 2013-12-11 DIAGNOSIS — I447 Left bundle-branch block, unspecified: Secondary | ICD-10-CM

## 2013-12-11 DIAGNOSIS — R0989 Other specified symptoms and signs involving the circulatory and respiratory systems: Secondary | ICD-10-CM

## 2013-12-11 DIAGNOSIS — I420 Dilated cardiomyopathy: Secondary | ICD-10-CM

## 2013-12-11 DIAGNOSIS — R609 Edema, unspecified: Secondary | ICD-10-CM

## 2013-12-11 DIAGNOSIS — R600 Localized edema: Secondary | ICD-10-CM

## 2013-12-11 DIAGNOSIS — I5022 Chronic systolic (congestive) heart failure: Secondary | ICD-10-CM

## 2013-12-11 LAB — MDC_IDC_ENUM_SESS_TYPE_INCLINIC
Battery Remaining Longevity: 114 mo
Battery Voltage: 3.01 V
Brady Statistic AP VP Percent: 0 %
Brady Statistic AS VP Percent: 0.04 %
Brady Statistic RA Percent Paced: 12.63 %
Brady Statistic RV Percent Paced: 0.04 %
HIGH POWER IMPEDANCE MEASURED VALUE: 247 Ohm
HighPow Impedance: 59 Ohm
HighPow Impedance: 74 Ohm
Lead Channel Impedance Value: 513 Ohm
Lead Channel Pacing Threshold Amplitude: 0.625 V
Lead Channel Pacing Threshold Amplitude: 1 V
Lead Channel Pacing Threshold Pulse Width: 0.4 ms
Lead Channel Sensing Intrinsic Amplitude: 19.375 mV
Lead Channel Sensing Intrinsic Amplitude: 29.75 mV
Lead Channel Setting Pacing Amplitude: 2 V
Lead Channel Setting Pacing Amplitude: 2.5 V
Lead Channel Setting Pacing Pulse Width: 0.4 ms
Lead Channel Setting Sensing Sensitivity: 0.3 mV
MDC IDC MSMT LEADCHNL RA SENSING INTR AMPL: 2.5 mV
MDC IDC MSMT LEADCHNL RA SENSING INTR AMPL: 3.125 mV
MDC IDC MSMT LEADCHNL RV IMPEDANCE VALUE: 570 Ohm
MDC IDC MSMT LEADCHNL RV PACING THRESHOLD PULSEWIDTH: 0.4 ms
MDC IDC SESS DTM: 20150901192721
MDC IDC SET ZONE DETECTION INTERVAL: 270 ms
MDC IDC SET ZONE DETECTION INTERVAL: 350 ms
MDC IDC SET ZONE DETECTION INTERVAL: 360 ms
MDC IDC STAT BRADY AP VS PERCENT: 12.63 %
MDC IDC STAT BRADY AS VS PERCENT: 87.33 %
Zone Setting Detection Interval: 320 ms
Zone Setting Detection Interval: 400 ms

## 2013-12-11 NOTE — Assessment & Plan Note (Signed)
This has increased per pt and her family has noticed.  Check Echo and Lexiscan myoview.  We will call results unless abnormal or worsening.  Then we will have her seen.

## 2013-12-11 NOTE — Progress Notes (Signed)
12/11/2013   PCP: Walker Kehr, MD   Chief Complaint  Patient presents with  . Follow-up    syncope in May    Primary Cardiologist: Dr. Harrington Challenger  HPI:  58 year old female with hx CHF in 2000. LVEF was initially in 20s She has had a cardiac cath in 2009 which showd no CAD -NICM. Last echo LVEF was 45% on echo in Jan 2013 She has known LBBB (old, present by report in 1082) She has had 3 AICDs. First rplaced due to faulty lead in 2005. Last was put in in Fall 2013. (ERI)   She had never had a CHF exacerbation. In 2013 Echo was done and showed an LVEF of 25 to 30%. She saw Beckie Salts in Sept 2013 Plan was to not make any device changes as she had recently had new ICD.  Her Ef has improved on last echo to 30-35% 10/2012.    Today she is here for follow up. She has more symptoms of SOB. DOE that other people notice.  She had an episode of syncope in early May in [place] that her husband found her in the floor.  She did not seek medical therapy.  She denies any new chest pain, continues with chest pain that she has had episodically for some time..  She has occ palpitations.  She still has significant home stress.  She was diagnosed with sleep apnea in [place] but she did not want to wear mask.      Allergies  Allergen Reactions  . Snail Extract Nausea And Vomiting  . Codeine Nausea And Vomiting  . Morphine And Related Nausea And Vomiting  . Penicillins Rash    Current Outpatient Prescriptions  Medication Sig Dispense Refill  . carvedilol (COREG) 25 MG tablet Take 25-37.5 mg by mouth 2 (two) times daily with a meal. 2 po in the am (25mg ) and 1 1/2 in the pm (37.5mg )      . ciprofloxacin (CIPRO) 500 MG tablet Take 1 tablet (500 mg total) by mouth 2 (two) times daily.  14 tablet  0  . clobetasol ointment (TEMOVATE) 3.33 % Apply 1 application topically 2 (two) times daily as needed (rash). As needed      . desonide (DESOWEN) 0.05 % ointment Apply 1 application topically 2 (two)  times daily as needed (rash). As needed      . DULoxetine (CYMBALTA) 60 MG capsule Take 60 mg by mouth daily. PT TAKES NAME BRAND ONLY FOR CYMBALTA      . fluconazole (DIFLUCAN) 150 MG tablet Take 150 mg by mouth as needed. Take one tablet po      . fluocinonide ointment (LIDEX) 5.45 % Apply 1 application topically 2 (two) times daily as needed (rash). Solution as needed      . furosemide (LASIX) 20 MG tablet Take 20-40 mg by mouth daily.      Marland Kitchen losartan (COZAAR) 100 MG tablet TAKE 1 TABLET BY MOUTH DAILY  30 tablet  0  . metroNIDAZOLE (FLAGYL) 500 MG tablet Take 1 tablet (500 mg total) by mouth 3 (three) times daily.  21 tablet  0  . NIFEdipine (PROCARDIA-XL/ADALAT-CC/NIFEDICAL-XL) 30 MG 24 hr tablet Take 1 tablet (30 mg total) by mouth 2 (two) times daily.  60 tablet  12  . ondansetron (ZOFRAN ODT) 4 MG disintegrating tablet Take 1 tablet (4 mg total) by mouth every 6 (six) hours as needed for nausea or vomiting.  [place]  tablet  0  . pantoprazole (PROTONIX) 40 MG tablet Take 1 tablet (40 mg total) by mouth daily.  90 tablet  3  . spironolactone (ALDACTONE) 25 MG tablet Take 50 mg by mouth daily.       . traMADol (ULTRAM) 50 MG tablet Take 50 mg by mouth every 6 (six) hours as needed for pain.       No current facility-administered medications for this visit.    Past Medical History  Diagnosis Date  . Cardiomyopathy   . ICD (implantable cardiac defibrillator) in place     Medtronic ICD  . Fibromyalgia   . CHF (congestive heart failure)   . Heart murmur   . Hypertension   . Depression   . Diverticulosis   . Gallstones   . IBS (irritable bowel syndrome)   . Complication of anesthesia     12'82-Cardiac Arrest in OR"positional LBBB"  . History of Helicobacter pylori infection   . Gastritis   . Dyspnea on exertion     Past Surgical History  Procedure Laterality Date  . Abdominal hysterectomy    . Colectomy  2001    sigmoid  . Cholecystectomy      '82  . Cardiac catheterization   1983    last 2010,-[place]  . Brain surgery  2004    "meningioma"Tumor-retained Titanium plate to cover skull  . Icd implanted Left     Medtronic  . Esophagogastroduodenoscopy (egd) with propofol N/A 02/13/2013    Procedure: ESOPHAGOGASTRODUODENOSCOPY (EGD) WITH PROPOFOL;  Surgeon: Jerene Bears, MD;  Location: [facility];  Service: Gastroenterology;  Laterality: N/A;  . Colonoscopy with propofol N/A 02/13/2013    Procedure: COLONOSCOPY WITH PROPOFOL;  Surgeon: Jerene Bears, MD;  Location: [facility];  Service: Gastroenterology;  Laterality: N/A;    URK:YHCWCBJ:SE colds or fevers, mild weight decrease Skin:no rashes or ulcers HEENT:no blurred vision, no congestion CV:see HPI PUL:see HPI GI:no diarrhea constipation or melena, no indigestion GU:no hematuria, no dysuria MS:no joint pain, no claudication Neuro:+ syncope, no lightheadedness Endo:no diabetes, no thyroid disease  Wt Readings from Last 3 Encounters:  12/11/13 262 lb (118.842 kg)  10/19/13 266 lb 8 oz (120.884 kg)  07/06/13 260 lb (117.935 kg)    PHYSICAL EXAM BP 152/80  Pulse 60  Ht 5\' 7"  (1.702 m)  Wt 262 lb (118.842 kg)  BMI 41.03 kg/m2 General:Pleasant affect, NAD Skin:Warm and dry, brisk capillary refill HEENT:normocephalic, sclera clear, mucus membranes moist Neck:supple, no JVD, no bruits  Heart:S1S2 RRR without murmur, gallup, rub or click Lungs:clear without rales, rhonchi, or wheezes GBT:DVVO, non tender, + BS, do not palpate liver spleen or masses Ext:1-2+ lower ext edema more in he feet, 2+ pedal pulses, 2+ radial pulses Neuro:alert and oriented, MAE, follows commands, + facial symmetry  ICD interrogated, 13 beats of NSVT on same day of syncope but after the syncope episode.  Her optivol has been up and down.  ASSESSMENT AND PLAN Dilated cardiomyopathy Will check Echo, it has been 1 year since her last eval.  She will follow up with Dr Lovena Le in next few months and Dr. Harrington Challenger in 3 months unless  tests are positive.   Dyspnea on exertion This has increased per pt and her family has noticed.  Check Echo and Lexiscan myoview.  We will call results unless abnormal or worsening.  Then we will have her seen.  Edema leg Bil.  I discussed increasing lasix for 4 days but she stated this was her normal edema.  So no change in lasix.   Will check CMP, TSH and Lipids today she has been fasting.

## 2013-12-11 NOTE — Assessment & Plan Note (Signed)
Bil.  I discussed increasing lasix for 4 days but she stated this was her normal edema.  So no change in lasix.

## 2013-12-11 NOTE — Assessment & Plan Note (Signed)
Will check Echo, it has been 1 year since her last eval.  She will follow up with Dr Lovena Le in next few months and Dr. Harrington Challenger in 3 months unless tests are positive.

## 2013-12-11 NOTE — Patient Instructions (Signed)
Your physician recommends that you have lab work today: [email or web address]  Your physician has requested that you have a lexiscan myoview. For further information please visit [email or web address]. Please follow instruction sheet, as given.  Your physician has requested that you have an echocardiogram. Echocardiography is a painless test that uses sound waves to create images of your heart. It provides your doctor with information about the size and shape of your heart and how well your heart's chambers and valves are working. This procedure takes approximately one hour. There are no restrictions for this procedure.  Your physician recommends that you schedule a follow-up appointment IN 3 MONTHS WITH DR. Harrington Challenger

## 2013-12-12 ENCOUNTER — Ambulatory Visit (INDEPENDENT_AMBULATORY_CARE_PROVIDER_SITE_OTHER): Payer: Managed Care, Other (non HMO) | Admitting: Internal Medicine

## 2013-12-12 ENCOUNTER — Encounter: Payer: Self-pay | Admitting: Internal Medicine

## 2013-12-12 VITALS — BP 132/78 | Temp 98.9°F | Wt 265.0 lb

## 2013-12-12 DIAGNOSIS — M5481 Occipital neuralgia: Secondary | ICD-10-CM | POA: Insufficient documentation

## 2013-12-12 DIAGNOSIS — F3289 Other specified depressive episodes: Secondary | ICD-10-CM

## 2013-12-12 DIAGNOSIS — M797 Fibromyalgia: Secondary | ICD-10-CM

## 2013-12-12 DIAGNOSIS — I420 Dilated cardiomyopathy: Secondary | ICD-10-CM

## 2013-12-12 DIAGNOSIS — F32A Depression, unspecified: Secondary | ICD-10-CM

## 2013-12-12 DIAGNOSIS — K2100 Gastro-esophageal reflux disease with esophagitis, without bleeding: Secondary | ICD-10-CM

## 2013-12-12 DIAGNOSIS — IMO0001 Reserved for inherently not codable concepts without codable children: Secondary | ICD-10-CM

## 2013-12-12 DIAGNOSIS — Z23 Encounter for immunization: Secondary | ICD-10-CM

## 2013-12-12 DIAGNOSIS — M531 Cervicobrachial syndrome: Secondary | ICD-10-CM

## 2013-12-12 DIAGNOSIS — F329 Major depressive disorder, single episode, unspecified: Secondary | ICD-10-CM

## 2013-12-12 DIAGNOSIS — I428 Other cardiomyopathies: Secondary | ICD-10-CM

## 2013-12-12 DIAGNOSIS — R55 Syncope and collapse: Secondary | ICD-10-CM

## 2013-12-12 LAB — COMPREHENSIVE METABOLIC PANEL
ALBUMIN: 3.5 g/dL (ref 3.5–5.2)
ALK PHOS: 38 U/L — AB (ref 39–117)
ALT: 18 U/L (ref 0–35)
AST: 21 U/L (ref 0–37)
BUN: 13 mg/dL (ref 6–23)
CO2: 27 mEq/L (ref 19–32)
Calcium: 9.2 mg/dL (ref 8.4–10.5)
Chloride: 102 mEq/L (ref 96–112)
Creatinine, Ser: 0.9 mg/dL (ref 0.4–1.2)
GFR: 65.42 mL/min (ref >60.00)
GLUCOSE: 99 mg/dL (ref 70–99)
POTASSIUM: 4 meq/L (ref 3.5–5.1)
Sodium: 139 mEq/L (ref 135–145)
TOTAL PROTEIN: 7.7 g/dL (ref 6.0–8.3)
Total Bilirubin: 0.6 mg/dL (ref 0.2–1.2)

## 2013-12-12 LAB — LIPID PANEL
CHOL/HDL RATIO: 5
CHOLESTEROL: 175 mg/dL (ref 0–200)
HDL: 36.6 mg/dL — ABNORMAL LOW (ref >39.00)
LDL CALC: 116 mg/dL — AB (ref 0–99)
NONHDL: 138.4
Triglycerides: 112 mg/dL (ref 0.0–149.0)
VLDL: 22.4 mg/dL (ref 0.0–40.0)

## 2013-12-12 NOTE — Assessment & Plan Note (Signed)
Continue with current prescription therapy as reflected on the Med list.  

## 2013-12-12 NOTE — Assessment & Plan Note (Signed)
5/15 ?etiology - card w/up in progress

## 2013-12-12 NOTE — Assessment & Plan Note (Signed)
Will block occip nerve if needed

## 2013-12-12 NOTE — Progress Notes (Signed)
Pre visit review using our clinic review tool, if applicable. No additional management support is needed unless otherwise documented below in the visit note. 

## 2013-12-12 NOTE — Progress Notes (Signed)
   Subjective:    HPI   C/o passing out in May 2015 - Cardiology said it was not cardiac F/u cardiomyopathy /hereditary - ICD since 2001 Dr Harrington Challenger; CHF, elev glucose, vit D def She has a h/o brain tumor F/u fibromyalgia sx's  x long time - not well controlled  BP is good at home  Wt Readings from Last 3 Encounters:  12/12/13 265 lb (120.203 kg)  12/11/13 262 lb (118.842 kg)  10/19/13 266 lb 8 oz (120.884 kg)   BP Readings from Last 3 Encounters:  12/12/13 132/78  12/11/13 152/80  10/19/13 122/76      Review of Systems  Constitutional: Negative for chills, activity change, appetite change, fatigue and unexpected weight change.  HENT: Negative for congestion, mouth sores and sinus pressure.   Eyes: Negative for visual disturbance.  Respiratory: Negative for cough and chest tightness.   Gastrointestinal: Negative for nausea and abdominal pain.  Genitourinary: Negative for frequency, difficulty urinating and vaginal pain.  Musculoskeletal: Negative for back pain and gait problem.  Skin: Negative for pallor and rash.  Neurological: Negative for dizziness, tremors, weakness, numbness and headaches.  Psychiatric/Behavioral: Negative for confusion and sleep disturbance.       Objective:   Physical Exam  Constitutional: She appears well-developed. No distress.  obese  HENT:  Head: Normocephalic.  Right Ear: External ear normal.  Left Ear: External ear normal.  Nose: Nose normal.  Mouth/Throat: Oropharynx is clear and moist.  Eyes: Conjunctivae are normal. Pupils are equal, round, and reactive to light. Right eye exhibits no discharge. Left eye exhibits no discharge.  Neck: Normal range of motion. Neck supple. No JVD present. No tracheal deviation present. No thyromegaly present.  Cardiovascular: Normal rate, regular rhythm and normal heart sounds.   Pulmonary/Chest: No stridor. No respiratory distress. She has no wheezes.  Abdominal: Soft. Bowel sounds are normal. She  exhibits no distension and no mass. There is no tenderness. There is no rebound and no guarding.  Musculoskeletal: She exhibits no edema and no tenderness.  Lymphadenopathy:    She has no cervical adenopathy.  Neurological: She displays normal reflexes. No cranial nerve deficit. She exhibits normal muscle tone. Coordination normal.  Skin: No rash noted. No erythema.  Psychiatric: She has a normal mood and affect. Her behavior is normal. Judgment and thought content normal.    Lab Results  Component Value Date   WBC 10.3 05/28/2013   HGB 13.6 05/28/2013   HCT 41.5 05/28/2013   PLT 224.0 05/28/2013   GLUCOSE 103* 05/28/2013   CHOL 168 05/28/2013   TRIG 131.0 05/28/2013   HDL 37.70* 05/28/2013   LDLCALC 104* 05/28/2013   ALT 18 05/28/2013   AST 17 05/28/2013   NA 139 05/28/2013   K 4.0 05/28/2013   CL 101 05/28/2013   CREATININE 0.9 05/28/2013   BUN 19 05/28/2013   CO2 26 05/28/2013   TSH 0.81 05/28/2013   HGBA1C 6.2 05/01/2012    Chart reviewed      Assessment & Plan:

## 2013-12-13 LAB — TSH: TSH: 0.76 u[IU]/mL (ref 0.35–4.50)

## 2013-12-13 NOTE — Progress Notes (Signed)
ICD check in clinic with Cecilie Kicks, NP. Normal device function. Thresholds and sensing consistent with previous device measurements. Impedance trends stable over time. 1 NSVT episode x 13 bts @ 214bpm on 5-16. No mode switches. SVT-ST episodes noted---max dur. 3 mins 14sec, Max Avg A/V 162. Histogram distribution appropriate for patient and level of activity. No changes made this session. Device programmed at appropriate safety margins. Unstable thoracic impedance---pt with chronic edema/SOB. Device programmed to optimize intrinsic conduction. Estimated longevity 9.4 years. Pt enrolled in remote follow-up. Plan to follow up with [facility] in 3 months. Patient education completed including shock plan. Alert tones demonstrated for patient.

## 2013-12-21 ENCOUNTER — Ambulatory Visit (HOSPITAL_COMMUNITY): Payer: Managed Care, Other (non HMO) | Attending: Cardiology | Admitting: Radiology

## 2013-12-21 ENCOUNTER — Ambulatory Visit (HOSPITAL_BASED_OUTPATIENT_CLINIC_OR_DEPARTMENT_OTHER): Payer: Managed Care, Other (non HMO) | Admitting: Cardiology

## 2013-12-21 VITALS — BP 148/65 | HR 60 | Ht 67.0 in | Wt 263.0 lb

## 2013-12-21 DIAGNOSIS — R002 Palpitations: Secondary | ICD-10-CM | POA: Diagnosis not present

## 2013-12-21 DIAGNOSIS — R0989 Other specified symptoms and signs involving the circulatory and respiratory systems: Secondary | ICD-10-CM | POA: Insufficient documentation

## 2013-12-21 DIAGNOSIS — R0609 Other forms of dyspnea: Secondary | ICD-10-CM | POA: Insufficient documentation

## 2013-12-21 DIAGNOSIS — I428 Other cardiomyopathies: Secondary | ICD-10-CM

## 2013-12-21 DIAGNOSIS — I447 Left bundle-branch block, unspecified: Secondary | ICD-10-CM | POA: Diagnosis not present

## 2013-12-21 DIAGNOSIS — I420 Dilated cardiomyopathy: Secondary | ICD-10-CM

## 2013-12-21 DIAGNOSIS — I10 Essential (primary) hypertension: Secondary | ICD-10-CM | POA: Insufficient documentation

## 2013-12-21 DIAGNOSIS — R079 Chest pain, unspecified: Secondary | ICD-10-CM | POA: Diagnosis present

## 2013-12-21 MED ORDER — ADENOSINE (DIAGNOSTIC) 3 MG/ML IV SOLN
0.5600 mg/kg | Freq: Once | INTRAVENOUS | Status: AC
  Administered 2013-12-21: 66.9 mg via INTRAVENOUS

## 2013-12-21 MED ORDER — TECHNETIUM TC 99M SESTAMIBI GENERIC - CARDIOLITE
33.0000 | Freq: Once | INTRAVENOUS | Status: AC | PRN
  Administered 2013-12-21: 33 via INTRAVENOUS

## 2013-12-21 NOTE — Progress Notes (Signed)
[facility] [address] [place],  [address] [phone]    Cardiology Nuclear Med Study  Whitney Jimenez is a 58 y.o. female     MRN : [id number]     DOB: 1955/12/27  Procedure Date: 12/21/2013  Nuclear Med Background Indication for Stress Test:  Evaluation for Ischemia History:  MPI 2014 EF 43%, AICD Cardiac Risk Factors: Hypertension and LBBB  Symptoms:  Chest Pain (last date of chest discomfort was last night), DOE, Palpitations and SOB   Nuclear Pre-Procedure Caffeine/Decaff Intake:  None> 12 hrs NPO After: 8:00pm   Lungs:  clear O2 Sat: 95% on room air. IV 0.9% NS with Angio Cath:  22g  IV Site: R Forearm x 1, tolerated well IV Started by:  Irven Baltimore, RN  Chest Size (in):  42 Cup Size: B  Height: 5\' 7"  (1.702 m)  Weight:  263 lb (119.296 kg)  BMI:  Body mass index is 41.18 kg/(m^2). Tech Comments:  Patient held Coreg x 24 hrs. Irven Baltimore, RN.    Nuclear Med Study 1 or 2 day study: 2 day  Stress Test Type:  Adenosine  Reading MD: N/A  Order Authorizing Provider:  Dorris Carnes, MD, and Cecilie Kicks, NP  Resting Radionuclide: Technetium 43m Sestamibi  Resting Radionuclide Dose: 33.0 mCi  On      12-24-13  Stress Radionuclide:  Technetium 95m Sestamibi  Stress Radionuclide Dose: 33.0 mCi  On        12-21-13          Stress Protocol Rest HR: 60 Stress HR: 108  Rest BP: 148/65 Stress BP: 151/53  Exercise Time (min): n/a METS: n/a           Dose of Adenosine (mg):  60 mg Dose of Lexiscan: n/a mg  Dose of Atropine (mg): n/a Dose of Dobutamine: n/a mcg/kg/min (at max HR)  Stress Test Technologist: Glade Lloyd, BS-ES  Nuclear Technologist:  Annye Rusk, CNMT     Rest Procedure:  Myocardial perfusion imaging was performed at rest 45 minutes following the intravenous administration of Technetium 73m Sestamibi. Rest ECG: NSR-LBBB  Stress Procedure:  The patient received IV adenosine at 140 mcg/kg/min for 4 minutes.  Technetium  68m Sestamibi was injected at the 2 minute mark and quantitative spect images were obtained after a 45 minute delay.  During the infusion of Adenosine the patient complained of feeling flushed and chest tightness.  These symptoms resolved in recovery.  Stress ECG: No significant change from baseline ECG  QPS Raw Data Images:  Normal; no motion artifact; normal heart/lung ratio. Stress Images:  Apical septal defect Rest Images:  Apical septal defect Subtraction (SDS):  No evidence of ischemia. Transient Ischemic Dilatation (Normal <1.22):  0.95 Lung/Heart Ratio (Normal <0.45):  0.27  Quantitative Gated Spect Images QGS EDV:  178 ml QGS ESV:  109 ml  Impression Exercise Capacity:  Adenosine study with no exercise. BP Response:  Normal blood pressure response. Clinical Symptoms:  flushed, chest tightness ECG Impression:  No significant ST segment change with adenosine. Comparison with Prior Nuclear Study: No previous nuclear study performed  Overall Impression:  Intermediate risk stress nuclear study (due to reduced EF) with fixed apical septal attenuation artifact due to LBBB. Suspect reduced EF is solely due to incoordinate septal motion. No reversible ischemia.  LV Ejection Fraction: 39%.  LV Wall Motion:  Incoordinate septal motion  Pixie Casino, MD, [facility] Board Certified in Nuclear Cardiology Attending Cardiologist [facility]

## 2013-12-21 NOTE — Progress Notes (Signed)
Echo performed. 

## 2013-12-24 ENCOUNTER — Ambulatory Visit (HOSPITAL_COMMUNITY): Payer: Managed Care, Other (non HMO) | Attending: Cardiology

## 2013-12-24 ENCOUNTER — Other Ambulatory Visit: Payer: Self-pay | Admitting: *Deleted

## 2013-12-24 DIAGNOSIS — I428 Other cardiomyopathies: Secondary | ICD-10-CM

## 2013-12-24 DIAGNOSIS — R0989 Other specified symptoms and signs involving the circulatory and respiratory systems: Secondary | ICD-10-CM

## 2013-12-24 MED ORDER — TECHNETIUM TC 99M SESTAMIBI GENERIC - CARDIOLITE
33.0000 | Freq: Once | INTRAVENOUS | Status: AC | PRN
  Administered 2013-12-24: 33 via INTRAVENOUS

## 2013-12-25 ENCOUNTER — Telehealth: Payer: Self-pay | Admitting: Internal Medicine

## 2013-12-25 ENCOUNTER — Other Ambulatory Visit (INDEPENDENT_AMBULATORY_CARE_PROVIDER_SITE_OTHER): Payer: Managed Care, Other (non HMO)

## 2013-12-25 DIAGNOSIS — I428 Other cardiomyopathies: Secondary | ICD-10-CM

## 2013-12-25 LAB — D-DIMER, QUANTITATIVE (NOT AT ARMC): D DIMER QUANT: 2.96 ug{FEU}/mL — AB (ref 0.00–0.48)

## 2013-12-25 LAB — BRAIN NATRIURETIC PEPTIDE: PRO B NATRI PEPTIDE: 58 pg/mL (ref 0.0–100.0)

## 2013-12-25 NOTE — Telephone Encounter (Signed)
New message      Pt had labs drawn this am.  She was under the understanding that a nurse was going to call her and let her know if further testing will need to be done.  She keeps on having a positive d-dimer test.  Please call.

## 2013-12-25 NOTE — Telephone Encounter (Signed)
Notified of lab results. 

## 2013-12-31 ENCOUNTER — Encounter: Payer: Self-pay | Admitting: Internal Medicine

## 2014-01-06 ENCOUNTER — Other Ambulatory Visit: Payer: Self-pay | Admitting: Internal Medicine

## 2014-03-01 ENCOUNTER — Ambulatory Visit (INDEPENDENT_AMBULATORY_CARE_PROVIDER_SITE_OTHER): Payer: Managed Care, Other (non HMO) | Admitting: Internal Medicine

## 2014-03-01 ENCOUNTER — Encounter: Payer: Self-pay | Admitting: Internal Medicine

## 2014-03-01 VITALS — BP 140/82 | HR 70 | Ht 65.0 in | Wt 264.0 lb

## 2014-03-01 DIAGNOSIS — I10 Essential (primary) hypertension: Secondary | ICD-10-CM

## 2014-03-01 DIAGNOSIS — I5022 Chronic systolic (congestive) heart failure: Secondary | ICD-10-CM

## 2014-03-01 NOTE — Progress Notes (Signed)
03/01/2014   PCP: Walker Kehr, MD   HPI:  58 year old female with hx CHF in 2000. LVEF was initially in 20s She has had a cardiac cath in 2009 which showd no CAD -NICM. Last echo LVEF was 45% on echo in Jan 2013 She has known LBBB (old, present by report in 1082) She has had 3 AICDs. First rplaced due to faulty lead in 2005. Last was put in in Fall 2013. (ERI)   She had never had a CHF exacerbation. In 2013 Echo was done and showed an LVEF of 25 to 30%. She saw Beckie Salts in Sept 2013 Plan was to not make any device changes as she had recently had new ICD.  Her Ef has improved on last echo to 30-35% 10/2012.   The patinet was seen by Serita Butcher in September.  She had an episode of syncope last spring  No assocatiate arrhythmia She was set up for an echo which showed LVEF was stable at 30 to 35%  Also set up for [facility] which showed no ischemia  Since seen she has had no further syncope.  Breating is stable.    Allergies  Allergen Reactions  . Snail Extract Nausea And Vomiting  . Codeine Nausea And Vomiting  . Morphine And Related Nausea And Vomiting  . Penicillins Rash    Current Outpatient Prescriptions  Medication Sig Dispense Refill  . carvedilol (COREG) 25 MG tablet Take 25-37.5 mg by mouth 2 (two) times daily with a meal. 2 po in the am (25mg ) and 1 1/2 in the pm (37.5mg )    . clobetasol ointment (TEMOVATE) 0.34 % Apply 1 application topically 2 (two) times daily as needed (rash). As needed    . desonide (DESOWEN) 0.05 % ointment Apply 1 application topically 2 (two) times daily as needed (rash). As needed    . DULoxetine (CYMBALTA) 60 MG capsule Take 60 mg by mouth daily. PT TAKES NAME BRAND ONLY FOR CYMBALTA    . fluconazole (DIFLUCAN) 150 MG tablet Take 150 mg by mouth as needed. Take one tablet po    . fluocinonide ointment (LIDEX) 9.17 % Apply 1 application topically 2 (two) times daily as needed (rash). Solution as needed    . furosemide (LASIX) 20 MG tablet Take 20-40 mg by  mouth daily.    Marland Kitchen losartan (COZAAR) 100 MG tablet TAKE 1 TABLET BY MOUTH EVERY DAY 30 tablet 2  . metroNIDAZOLE (FLAGYL) 500 MG tablet Take 1 tablet (500 mg total) by mouth 3 (three) times daily. 21 tablet 0  . NIFEdipine (PROCARDIA-XL/ADALAT-CC/NIFEDICAL-XL) 30 MG 24 hr tablet Take 1 tablet (30 mg total) by mouth 2 (two) times daily. 60 tablet 12  . ondansetron (ZOFRAN ODT) 4 MG disintegrating tablet Take 1 tablet (4 mg total) by mouth every 6 (six) hours as needed for nausea or vomiting. 30 tablet 0  . pantoprazole (PROTONIX) 40 MG tablet Take 1 tablet (40 mg total) by mouth daily. 90 tablet 3  . spironolactone (ALDACTONE) 25 MG tablet Take 50 mg by mouth daily.      No current facility-administered medications for this visit.    Past Medical History  Diagnosis Date  . Cardiomyopathy   . ICD (implantable cardiac defibrillator) in place     Medtronic ICD  . Fibromyalgia   . CHF (congestive heart failure)   . Heart murmur   . Hypertension   . Depression   . Diverticulosis   . Gallstones   . IBS (irritable bowel  syndrome)   . Complication of anesthesia     12'82-Cardiac Arrest in OR"positional LBBB"  . History of Helicobacter pylori infection   . Gastritis   . Dyspnea on exertion     Past Surgical History  Procedure Laterality Date  . Abdominal hysterectomy    . Colectomy  2001    sigmoid  . Cholecystectomy      '82  . Cardiac catheterization  1983    last 2010,-[place]  . Brain surgery  2004    "meningioma"Tumor-retained Titanium plate to cover skull  . Icd implanted Left     Medtronic  . Esophagogastroduodenoscopy (egd) with propofol N/A 02/13/2013    Procedure: ESOPHAGOGASTRODUODENOSCOPY (EGD) WITH PROPOFOL;  Surgeon: Jerene Bears, MD;  Location: [facility];  Service: Gastroenterology;  Laterality: N/A;  . Colonoscopy with propofol N/A 02/13/2013    Procedure: COLONOSCOPY WITH PROPOFOL;  Surgeon: Jerene Bears, MD;  Location: [facility];  Service: Gastroenterology;   Laterality: N/A;    VEH:MCNOBSJ:GG colds or fevers, mild weight decrease Skin:no rashes or ulcers HEENT:no blurred vision, no congestion CV:see HPI PUL:see HPI GI:no diarrhea constipation or melena, no indigestion GU:no hematuria, no dysuria MS:no joint pain, no claudication Neuro:+ syncope, no lightheadedness Endo:no diabetes, no thyroid disease  Wt Readings from Last 3 Encounters:  03/01/14 264 lb (119.75 kg)  12/21/13 263 lb (119.296 kg)  12/12/13 265 lb (120.203 kg)    PHYSICAL EXAM BP 140/82 mmHg  Pulse 70  Ht 5\' 5"  (1.651 m)  Wt 264 lb (119.75 kg)  BMI 43.93 kg/m2 General:Pleasant affect, NAD Skin:Warm and dry, brisk capillary refill HEENT:normocephalic, sclera clear, mucus membranes moist Neck:supple, no JVD, no bruits  Heart:S1S2 RRR without murmur, gallup, rub or click Lungs:clear without rales, rhonchi, or wheezes EZM:OQHU, non tender, + BS, do not palpate liver spleen or masses Ext:1-2+ lower ext edema more in he feet, 2+ pedal pulses, 2+ radial pulses Neuro:alert and oriented, MAE, follows commands, + facial symmetry  ASSESSMENT AND PLAN  1,  Nonischemic cardiomyopathy.  Volume statu OK  I would keep on current regimen    2.  HTN  Continue current meds    3.  Syncope  Follow  No evid for arrhythmia  .

## 2014-03-01 NOTE — Patient Instructions (Signed)
Your physician recommends that you continue on your current medications as directed. Please refer to the Current Medication list given to you today. Your physician wants you to follow-up in: 5 months (April 2016) with Dr. Harrington Challenger.  You will receive a reminder letter in the mail two months in advance. If you don't receive a letter, please call our office to schedule the follow-up appointment.

## 2014-03-21 ENCOUNTER — Ambulatory Visit (INDEPENDENT_AMBULATORY_CARE_PROVIDER_SITE_OTHER): Payer: Managed Care, Other (non HMO) | Admitting: Internal Medicine

## 2014-03-21 ENCOUNTER — Encounter: Payer: Self-pay | Admitting: Internal Medicine

## 2014-03-21 VITALS — BP 150/82 | HR 64 | Ht 65.0 in | Wt 267.8 lb

## 2014-03-21 DIAGNOSIS — K2970 Gastritis, unspecified, without bleeding: Secondary | ICD-10-CM

## 2014-03-21 DIAGNOSIS — K2980 Duodenitis without bleeding: Secondary | ICD-10-CM

## 2014-03-21 DIAGNOSIS — R1013 Epigastric pain: Secondary | ICD-10-CM

## 2014-03-21 MED ORDER — PANTOPRAZOLE SODIUM 40 MG PO TBEC: 40.0000 mg | DELAYED_RELEASE_TABLET | Freq: Two times a day (BID) | ORAL | Status: DC

## 2014-03-21 MED ORDER — SUCRALFATE 1 GM/10ML PO SUSP: 1.0000 g | Freq: Three times a day (TID) | ORAL | Status: DC

## 2014-03-21 NOTE — Patient Instructions (Signed)
We have sent the following medications to your pharmacy for you to pick up at your convenience:  Protonix, Carafate  Increase your Protonix to twice a day for 4-6 weeks.  You may use your Zofran as needed for nausea.  Avoid NSAIDS.  Call the office if you are not better in 2-3 weeks.  Please follow up with Dr. Henrene Pastor on 05/22/2014 at 9:00am

## 2014-03-21 NOTE — Progress Notes (Signed)
   Subjective:    Patient ID: Whitney Jimenez, female    DOB: Jul 06, 1955, 58 y.o.   MRN: [id number]  HPI Whitney Jimenez is a 58 yo female with PMH of hereditary cardiomyopathy now with ICD, hypertension, diverticulosis status post sigmoid resection for diverticulitis, fibromyalgia, meningioma status post resection, H. pylori gastritis status post treatment with Pylera, history of hysterectomy and cholecystectomy who is here for follow-up. She is here alone today. She was last seen one year ago. She did have an H. pylori stool antigen in January 2015 which was negative indicative of eradication.  She reports over the last 1-2 weeks having developed epigastric abdominal pain. This is burning in nature. It is somewhat intermittent though worse with eating. Drinking orange juice particularly makes it more intense. She has had associated nausea but no vomiting. No heartburn. She is using pantoprazole 40 mg daily. Bowel movements have been regular without blood or melena. No lower abdominal pain or discomfort. No early satiety. No fevers or chills. She uses Aleve on occasion for joint pains. She admits to an extremely stressful home situation over the last several months. She alluded this is involving her husband but admits they are still together. She did not elaborate further.   Review of Systems As per HPI, otherwise negative  Current Medications, Allergies, Past Medical History, Past Surgical History, Family History and Social History were reviewed in [employer] record.     Objective:   Physical Exam BP 150/82 mmHg  Pulse 64  Ht 5\' 5"  (1.651 m)  Wt 267 lb 12.8 oz (121.473 kg)  BMI 44.56 kg/m2 Constitutional: Well-developed and well-nourished. No distress. HEENT: Normocephalic and atraumatic. Oropharynx is clear and moist. No oropharyngeal exudate. Conjunctivae are normal.  No scleral icterus. Neck: Neck supple. Trachea midline. Cardiovascular: Normal rate, regular rhythm  and intact distal pulses. No M/R/G Pulmonary/chest: Effort normal and breath sounds normal. No wheezing, rales or rhonchi. Abdominal: Soft, epigastric tenderness without rebound or guarding, nondistended. Bowel sounds active throughout.  Extremities: no clubbing, cyanosis, or edema Lymphadenopathy: No cervical adenopathy noted. Neurological: Alert and oriented to person place and time. Skin: Skin is warm and dry. No rashes noted. Psychiatric: Normal mood and affect. Behavior is normal.  EGD/colonoscopy -- reviewed    TTG normal  Assessment & Plan:   58 yo female with PMH of hereditary cardiomyopathy now with ICD, hypertension, diverticulosis status post sigmoid resection for diverticulitis, fibromyalgia, meningioma status post resection, H. pylori gastritis status post treatment with Pylera, history of hysterectomy and cholecystectomy who is here for follow-up.  1. Epigastric pain -- consistent with gastroduodenitis or even ulcer disease. Increase pantoprazole to 40 mg twice daily for 1 month. Add Carafate 1 g before meals and at bedtime. Avoid NSAIDs. Call the office in 2-3 weeks to let me know if she is improving. If not we'll likely repeat endoscopy and consider cross-sectional imaging.  2. Episodic abdominal pain with history of abnormal imaging -- no lower GI complaint over the last year and she reports this is been doing well. No evidence of IBD at upper or lower endoscopy  3. History of colon polyps -- repeat colonoscopy recommended 5 years from last colonoscopy  4. H. pylori gastritis -- biopsied proven, treated with Pylera. Stool antigen negative therefore assume she remains negative. If pain continues will likely repeat endoscopy and can biopsy at that time. Husband was checked for H. pylori and negative

## 2014-04-08 ENCOUNTER — Other Ambulatory Visit: Payer: Self-pay | Admitting: Internal Medicine

## 2014-04-09 NOTE — Telephone Encounter (Signed)
Rx refill sent to patient pharmacy   

## 2014-04-17 ENCOUNTER — Encounter: Payer: Self-pay | Admitting: *Deleted

## 2014-04-19 ENCOUNTER — Telehealth: Payer: Self-pay

## 2014-04-19 ENCOUNTER — Other Ambulatory Visit: Payer: Self-pay | Admitting: Internal Medicine

## 2014-04-19 MED ORDER — CARVEDILOL 25 MG PO TABS: ORAL_TABLET | ORAL | Status: DC

## 2014-04-19 NOTE — Telephone Encounter (Signed)
Spoke with patient. She states she takes coreg all at once every day.  2 and one half tablets at a time. Educated her on medication compliance.  She states if she doesn't take them just in the am she will forget the evening dose.  Sent reorder to [facility] (pt was there during the phone call)--for 25 mg in AM and 37.5 mg in PM.

## 2014-04-28 NOTE — Telephone Encounter (Signed)
I would encourage her to try to take 1/2 in am 1/2 at bedtime (use pill dispenser) Extended release COreg could be tried  Prob have higher copay monthly

## 2014-04-28 NOTE — Telephone Encounter (Signed)
Note done previously

## 2014-05-10 ENCOUNTER — Encounter: Payer: Self-pay | Admitting: *Deleted

## 2014-05-17 ENCOUNTER — Other Ambulatory Visit: Payer: Self-pay | Admitting: Internal Medicine

## 2014-05-22 ENCOUNTER — Encounter: Payer: Self-pay | Admitting: Internal Medicine

## 2014-05-22 ENCOUNTER — Ambulatory Visit (INDEPENDENT_AMBULATORY_CARE_PROVIDER_SITE_OTHER): Payer: Managed Care, Other (non HMO) | Admitting: Internal Medicine

## 2014-05-22 VITALS — BP 130/70 | HR 72 | Ht 65.0 in | Wt 266.2 lb

## 2014-05-22 DIAGNOSIS — R1013 Epigastric pain: Secondary | ICD-10-CM

## 2014-05-22 DIAGNOSIS — Z8619 Personal history of other infectious and parasitic diseases: Secondary | ICD-10-CM

## 2014-05-22 DIAGNOSIS — Z86010 Personal history of colonic polyps: Secondary | ICD-10-CM

## 2014-05-22 DIAGNOSIS — R1031 Right lower quadrant pain: Secondary | ICD-10-CM

## 2014-05-22 DIAGNOSIS — K2990 Gastroduodenitis, unspecified, without bleeding: Secondary | ICD-10-CM

## 2014-05-22 DIAGNOSIS — K2970 Gastritis, unspecified, without bleeding: Secondary | ICD-10-CM

## 2014-05-22 MED ORDER — METRONIDAZOLE 500 MG PO TABS: 500.0000 mg | ORAL_TABLET | Freq: Three times a day (TID) | ORAL | Status: AC

## 2014-05-22 MED ORDER — PANTOPRAZOLE SODIUM 40 MG PO TBEC: 40.0000 mg | DELAYED_RELEASE_TABLET | Freq: Every day | ORAL | Status: DC

## 2014-05-22 NOTE — Progress Notes (Signed)
   Subjective:    Patient ID: Whitney Jimenez, female    DOB: 17-Mar-1956, 59 y.o.   MRN: [id number]  HPI Whitney Jimenez is a 59 yo female with PMH of diverticulosis status post sigmoid resection for diverticulitis, H. pylori status post treatment, hereditary cardiomyopathy with ICD, hypertension, fibromyalgia, meningioma status post resection who is here for follow-up. She was last seen on 03/21/2014 at which point she was having epigastric abdominal pain. Pantoprazole was increased to 40 mg twice daily and Carafate liquid was recommended. It was felt that she had gastritis secondary to NSAID use.  Today she reports the Carafate tasted terrible and she only took it once. She has been taking pantoprazole 40 mg twice daily. Her epigastric abdominal pain has resolved. No heartburn. Bowel movements have been regular without change though they are occasionally loose. For the last 3 days she's noted right-sided abdominal discomfort. This is somewhat worse with movement. She thought that this was secondary to injury from her dog jumping on her but she has not noticed any bruising. No fevers or chills. No rectal bleeding or melena.   Review of Systems As per history of present illness, otherwise negative  Current Medications, Allergies, Past Medical History, Past Surgical History, Family History and Social History were reviewed in [employer] record.     Objective:   Physical Exam BP 130/70 mmHg  Pulse 72  Ht 5\' 5"  (1.651 m)  Wt 266 lb 3.2 oz (120.748 kg)  BMI 44.30 kg/m2  SpO2 95% Constitutional: Well-developed and well-nourished. No distress. HEENT: Normocephalic and atraumatic.Marland Kitchen Conjunctivae are normal.  No scleral icterus. Neck: Neck supple. Trachea midline. Cardiovascular: Normal rate, regular rhythm and intact distal pulses. No M/R/G Pulmonary/chest: Effort normal and breath sounds normal. No wheezing, rales or rhonchi. Abdominal: Soft, obese, rt-sided discomfort with  light palpation, no rebound or guarding, nondistended. Bowel sounds active throughout.  Extremities: no clubbing, cyanosis, trace LE edema Neurological: Alert and oriented to person place and time. Skin: Skin is warm and dry. No rashes noted. Psychiatric: Normal mood and affect. Behavior is normal.  CT scan reviewed from sept 2014 with findings suspicious for enterocolitis Colonoscopy subsequent to that CT also reviewed no evidence for colitis     Assessment & Plan:  59 yo female with PMH of diverticulosis status post sigmoid resection for diverticulitis, H. pylori status post treatment, hereditary cardiomyopathy with ICD, hypertension, fibromyalgia, meningioma status post resection who is here for follow-up.   1. Epigastric pain/gastiritis -- resolved epigastric abdominal pain on twice a day PPI. She has avoided NSAIDs which likely was the culprit. I asked that she continue to avoid NSAIDs. Will decrease pantoprazole back to 40 mg once daily. If symptoms return she is asked to notify me. She voices understanding. Previous history of H. pylori status post treatment with documentation of eradication  2. RLQ pain -- only 2-3 days in duration and mild in nature. Nontoxic. I asked that she monitor this pain closely and if persistent or worsening in any way to notify me. Given her history of enterocolitis and also diverticulitis, would have low threshold to repeat CT scan of the abdomen and pelvis for persistent symptoms. She understands this recommendation  3. History of colon polyps -- repeat colonoscopy interval is 5 years from last exam  One year follow-up, sooner if necessary

## 2014-05-22 NOTE — Patient Instructions (Signed)
Please decrease your pantoprazole to 40 mg once daily.  We have sent a refill of Flagyl to your pharmacy that you may use when needed.  Please follow up with Dr Hilarie Fredrickson in 1 year, or sooner if needed.  Should your right sided abdominal pain continue or worsen, please call our office at [phone].  CC:Dr Plotnikov

## 2014-06-03 ENCOUNTER — Telehealth: Payer: Self-pay | Admitting: Internal Medicine

## 2014-06-03 NOTE — Telephone Encounter (Signed)
Thanks for keeping [place] informed Please let us know if gi symptoms return/continue

## 2014-06-03 NOTE — Telephone Encounter (Signed)
Pt states she went to be last night and was fine but woke up with dry heaves. States after that she had some stomach soreness and an underlying ache. All this is gone now and she has not had nausea today. States her heart has been giving her issues and it may have something to do with this and she is seeing the PA with Dr. Harrington Challenger the first part of March. Pt wanted to let Dr. Hilarie Fredrickson know.

## 2014-06-03 NOTE — Telephone Encounter (Signed)
Left message for pt to call back  °

## 2014-06-06 ENCOUNTER — Encounter: Payer: Self-pay | Admitting: *Deleted

## 2014-06-11 ENCOUNTER — Encounter: Payer: Self-pay | Admitting: Internal Medicine

## 2014-06-12 ENCOUNTER — Ambulatory Visit: Payer: Managed Care, Other (non HMO) | Admitting: Internal Medicine

## 2014-06-14 ENCOUNTER — Encounter: Payer: Self-pay | Admitting: Internal Medicine

## 2014-06-14 ENCOUNTER — Ambulatory Visit (INDEPENDENT_AMBULATORY_CARE_PROVIDER_SITE_OTHER): Payer: Managed Care, Other (non HMO) | Admitting: Physician Assistant

## 2014-06-14 ENCOUNTER — Encounter: Payer: Self-pay | Admitting: Physician Assistant

## 2014-06-14 ENCOUNTER — Ambulatory Visit (INDEPENDENT_AMBULATORY_CARE_PROVIDER_SITE_OTHER): Payer: Managed Care, Other (non HMO) | Admitting: *Deleted

## 2014-06-14 VITALS — BP 128/78 | HR 56 | Ht 65.0 in | Wt 263.4 lb

## 2014-06-14 DIAGNOSIS — I420 Dilated cardiomyopathy: Secondary | ICD-10-CM

## 2014-06-14 DIAGNOSIS — I5022 Chronic systolic (congestive) heart failure: Secondary | ICD-10-CM | POA: Diagnosis not present

## 2014-06-14 DIAGNOSIS — R0602 Shortness of breath: Secondary | ICD-10-CM

## 2014-06-14 DIAGNOSIS — Z95810 Presence of automatic (implantable) cardiac defibrillator: Secondary | ICD-10-CM | POA: Diagnosis not present

## 2014-06-14 DIAGNOSIS — I447 Left bundle-branch block, unspecified: Secondary | ICD-10-CM

## 2014-06-14 LAB — MDC_IDC_ENUM_SESS_TYPE_INCLINIC
Battery Remaining Longevity: 108 mo
Brady Statistic AP VP Percent: 0.01 %
Brady Statistic AP VS Percent: 11.31 %
Brady Statistic AS VS Percent: 88.64 %
Date Time Interrogation Session: 20160304151620
HighPow Impedance: 247 Ohm
HighPow Impedance: 56 Ohm
HighPow Impedance: 67 Ohm
Lead Channel Impedance Value: 494 Ohm
Lead Channel Pacing Threshold Amplitude: 1 V
Lead Channel Pacing Threshold Pulse Width: 0.4 ms
Lead Channel Pacing Threshold Pulse Width: 0.4 ms
Lead Channel Sensing Intrinsic Amplitude: 2.5 mV
Lead Channel Sensing Intrinsic Amplitude: 2.625 mV
Lead Channel Sensing Intrinsic Amplitude: 20.375 mV
Lead Channel Setting Pacing Amplitude: 2.5 V
Lead Channel Setting Pacing Pulse Width: 0.4 ms
Lead Channel Setting Sensing Sensitivity: 0.3 mV
MDC IDC MSMT BATTERY VOLTAGE: 3.01 V
MDC IDC MSMT LEADCHNL RV IMPEDANCE VALUE: 513 Ohm
MDC IDC MSMT LEADCHNL RV PACING THRESHOLD AMPLITUDE: 0.625 V
MDC IDC MSMT LEADCHNL RV SENSING INTR AMPL: 27.625 mV
MDC IDC SET LEADCHNL RA PACING AMPLITUDE: 2 V
MDC IDC SET ZONE DETECTION INTERVAL: 320 ms
MDC IDC SET ZONE DETECTION INTERVAL: 360 ms
MDC IDC SET ZONE DETECTION INTERVAL: 400 ms
MDC IDC STAT BRADY AS VP PERCENT: 0.04 %
MDC IDC STAT BRADY RA PERCENT PACED: 11.32 %
MDC IDC STAT BRADY RV PERCENT PACED: 0.04 %
Zone Setting Detection Interval: 270 ms
Zone Setting Detection Interval: 350 ms

## 2014-06-14 LAB — BASIC METABOLIC PANEL
BUN: 14 mg/dL (ref 6–23)
CHLORIDE: 102 meq/L (ref 96–112)
CO2: 28 meq/L (ref 19–32)
Calcium: 9.4 mg/dL (ref 8.4–10.5)
Creat: 0.84 mg/dL (ref 0.50–1.10)
Glucose, Bld: 106 mg/dL — ABNORMAL HIGH (ref 70–99)
Potassium: 3.8 mEq/L (ref 3.5–5.3)
SODIUM: 139 meq/L (ref 135–145)

## 2014-06-14 MED ORDER — CARVEDILOL 25 MG PO TABS: ORAL_TABLET | ORAL | Status: AC

## 2014-06-14 NOTE — Patient Instructions (Signed)
Your physician has recommended you make the following change in your medication:   Decrease Coreg 25 mg by mouth twice a daily with food.  Your physician recommends that you schedule a follow-up appointment next available with Dr. Lovena Le.  Your physician recommends that you have labs today BMET, BNP, and TSH

## 2014-06-14 NOTE — Progress Notes (Signed)
ICD check in clinic. Normal device function. Thresholds and sensing consistent with previous device measurements. Impedance trends stable over time. No evidence of any ventricular arrhythmias. No mode switches. Histogram distribution appropriate for patient and level of activity. No changes made this session. Device programmed at appropriate safety margins. Device programmed to optimize intrinsic conduction. Estimated longevity 8.41yrs. ROV w/ Dr. Lovena Le in 45mo.

## 2014-06-14 NOTE — Progress Notes (Signed)
Cardiology Office Note   Date:  06/14/2014   ID:  Whitney Jimenez, DOB 11/01/1955, MRN [id number]  PCP:  Walker Kehr, MD  Cardiologist: Dr. Harrington Challenger    CC: SOB   History of Present Illness: Whitney Jimenez is a 59 y.o. female with a history of chronic systolic CHF/NICM (cath in 2008 with no CAD) s/p ICD placement, LBBB, HTN, diverticulosis s/p sigmoid resection, fibromyalgia, meningioma s/p resection and OSA- intolerant of CPAP who presents today for palpitations and SOB.   She was seen by Cecilie Kicks in 12/2013 for evaluation of SOB and an episode of syncope earlier that year. A 2D ECHO and lexiscan myoview were ordered. 2D ECHO revealed EF 30-35%, septal/inf severe HK, septal-lat dyssyncrony c/w LBBB, G1DD, Aortic sclerosis w/o stenosis: mean grad 8. Normal RV and systolic fxn and Lexiscan myoview (12/25/13): Intermediate risk stress nuclear study (due to reduced EF) with fixed apical septal attenuation artifact due to LBBB. Suspect reduced EF is solely due to incoordinate septal motion. No reversible ischemia. LV EF: 39%.  Today she presents for "heart troubles." Sometimes her heart races and sometimes her heart aches. Sometimes she has upper left arm pain. Her arms feel numb and "blah." Some lightheadedness and dizziness from time to time. She has felt a little more SOB and is having to prop up on more pillows. She is up to 4 pillows from 2. She has chronic LE edema which she doesn't think is worse. Some PND. She has been feeling more fatigued lately.        Past Medical History  Diagnosis Date  . Cardiomyopathy   . ICD (implantable cardiac defibrillator) in place     Medtronic ICD  . Fibromyalgia   . CHF (congestive heart failure)   . Heart murmur   . Hypertension   . Depression   . Diverticulosis   . Gallstones   . IBS (irritable bowel syndrome)   . Complication of anesthesia     12'82-Cardiac Arrest in OR"positional LBBB"  . History of Helicobacter pylori infection   .  Gastritis   . Dyspnea on exertion   . Colon polyps     adenomatous  . GERD (gastroesophageal reflux disease)   . Enterocolitis   . Diverticulosis   . Tubular adenoma of colon     Past Surgical History  Procedure Laterality Date  . Abdominal hysterectomy    . Colectomy  2001    sigmoid  . Cholecystectomy      '82  . Cardiac catheterization  1983    last 2010,-[place]  . Brain surgery  2004    "meningioma"Tumor-retained Titanium plate to cover skull  . Icd implanted Left     Medtronic  . Esophagogastroduodenoscopy (egd) with propofol N/A 02/13/2013    Procedure: ESOPHAGOGASTRODUODENOSCOPY (EGD) WITH PROPOFOL;  Surgeon: Jerene Bears, MD;  Location: [facility];  Service: Gastroenterology;  Laterality: N/A;  . Colonoscopy with propofol N/A 02/13/2013    Procedure: COLONOSCOPY WITH PROPOFOL;  Surgeon: Jerene Bears, MD;  Location: [facility];  Service: Gastroenterology;  Laterality: N/A;     Current Outpatient Prescriptions  Medication Sig Dispense Refill  . carvedilol (COREG) 25 MG tablet One tablet by mouth twice daily 75 tablet 11  . clobetasol ointment (TEMOVATE) 7.61 % Apply 1 application topically 2 (two) times daily as needed (rash). As needed    . desonide (DESOWEN) 0.05 % ointment Apply 1 application topically 2 (two) times daily as needed (rash). As needed    .  DULoxetine (CYMBALTA) 60 MG capsule Take 60 mg by mouth daily. PT TAKES NAME BRAND ONLY FOR CYMBALTA    . fluconazole (DIFLUCAN) 150 MG tablet Take 150 mg by mouth as needed. Take one tablet po    . fluocinonide ointment (LIDEX) 7.10 % Apply 1 application topically 2 (two) times daily as needed (rash). Solution as needed    . furosemide (LASIX) 20 MG tablet Take 20-40 mg by mouth daily.    Marland Kitchen losartan (COZAAR) 100 MG tablet Take 1 tablet (100 mg total) by mouth daily. 30 tablet 4  . metroNIDAZOLE (FLAGYL) 500 MG tablet Take 1 tablet (500 mg total) by mouth 3 (three) times daily. 21 tablet 0  . NIFEdipine  (PROCARDIA-XL/ADALAT-CC/NIFEDICAL-XL) 30 MG 24 hr tablet Take 1 tablet (30 mg total) by mouth 2 (two) times daily. 60 tablet 12  . ondansetron (ZOFRAN ODT) 4 MG disintegrating tablet Take 1 tablet (4 mg total) by mouth every 6 (six) hours as needed for nausea or vomiting. 30 tablet 0  . pantoprazole (PROTONIX) 40 MG tablet Take 1 tablet (40 mg total) by mouth daily. 90 tablet 3  . pantoprazole (PROTONIX) 40 MG tablet Take 1 tablet (40 mg total) by mouth daily. 180 tablet 1  . spironolactone (ALDACTONE) 25 MG tablet Take 50 mg by mouth daily.     . sucralfate (CARAFATE) 1 GM/10ML suspension Take 10 mLs (1 g total) by mouth 4 (four) times daily -  before meals and at bedtime. 420 mL 1   No current facility-administered medications for this visit.    Allergies:   Codeine; Morphine and related; Snail extract; and Penicillins    Social History:  The patient  reports that she has never smoked. She has never used smokeless tobacco. She reports that she drinks alcohol. She reports that she does not use illicit drugs.   Family History:  The patient's family history includes Diabetes in her maternal grandmother and mother; Heart disease in her father, maternal grandmother, mother, paternal grandfather, and paternal grandmother; Lung cancer in her paternal grandmother. There is no history of Colon cancer.    ROS:  Please see the history of present illness.   Otherwise, review of systems are positive for none.   All other systems are reviewed and negative.    PHYSICAL EXAM: VS:  BP 128/78 mmHg  Pulse 56  Ht 5\' 5"  (1.651 m)  Wt 263 lb 6.4 oz (119.477 kg)  BMI 43.83 kg/m2 , BMI Body mass index is 43.83 kg/(m^2). GEN: Well nourished, well developed, in no acute distress HEENT: normal Neck: no JVD, carotid bruits, or masses Cardiac: RRR bradycardua; no murmurs, rubs, or gallops, 2+ chronic bilateral LE edema  Respiratory:  clear to auscultation bilaterally, normal work of breathing GI: soft,  nontender, nondistended, + BS MS: no deformity or atrophy Skin: warm and dry, no rash Neuro:  Strength and sensation are intact Psych: euthymic mood, full affect   EKG:  EKG is ordered today. The ekg ordered today demonstrates sinus brady LAD, LBBB. HR 56.    Recent Labs: 12/11/2013: ALT 18; BUN 13; Creatinine 0.9; Potassium 4.0; Sodium 139; TSH 0.76 12/25/2013: Pro B Natriuretic peptide (BNP) 58.0    Lipid Panel    Component Value Date/Time   CHOL 175 12/11/2013 1644   TRIG 112.0 12/11/2013 1644   HDL 36.60* 12/11/2013 1644   CHOLHDL 5 12/11/2013 1644   VLDL 22.4 12/11/2013 1644   LDLCALC 116* 12/11/2013 1644      Wt Readings from  Last 3 Encounters:  06/14/14 263 lb 6.4 oz (119.477 kg)  05/22/14 266 lb 3.2 oz (120.748 kg)  03/21/14 267 lb 12.8 oz (121.473 kg)      Other studies Reviewed: Additional studies/ records that were reviewed today include: 2D ECHO, nuclear stress test Review of the above records demonstrates: -- 2D ECHO (12/21/13) EF 30-35%, septal/inf severe HK, septal-lat dyssyncrony c/w LBBB, G1DD, Aortic sclerosis w/o stenosis: mean grad 8. Normal RV and systolic fxn. -- Lexiscan myoview (12/25/13): Intermediate risk stress nuclear study (due to reduced EF) with fixed apical septal attenuation artifact due to LBBB. Suspect reduced EF is solely due to incoordinate septal motion. No reversible ischemia. LV EF: 39%. LV Wall Motion: Incoordinate septal motion   ASSESSMENT AND PLAN:  Whitney Jimenez is a 59 y.o. female with a history of chronic systolic CHF/NICM (cath in 2008 with no CAD) s/p ICD placement, LBBB, HTN, diverticulosis s/p sigmoid resection, fibromyalgia, meningioma s/p resection and OSA- intolerant of CPAP who presents today for palpitations and SOB.    Dilated cardiomyopathy- EF 30-35% by ECHO (05/2013). Stable from previous -- s/p ICD placement. Device interrogated today with no events and device functioning properly. Will have her follow up with  Dr. Lovena Le to see if she would benefit from CRT in the setting of LBBB and SOB.  -- Continue BB and ARB. Continue Lasix 20mg  qd.  Dyspnea on exertion- will order a BNP and BMET. If BNP elevated will increase lasix.   LBBB- she may benefit from CRT. She currently has a single lead device. Will set up to see Dr. Lovena Le to see if CRT would benefit her   Bradycardia- HR 56 today and she does complain of worsening fatigue. She has been taking carvedilol 25mg  AM and 37.5mg  PM. Will decrease to 25mg  BID.   Palpitations- device interrogated today with no events and device functioning properly.  -- ECG NSR, sinus brady.  Fatigue- will order a TSH and reduce BB as above.   HTN- BP well controlled on losartan 100mg , nifedipine SR 30mg  BID, carvedilol 25mg  AM and 37.5mg  PM. Will decrease Coreg to 25mg  BID due to bradycardia  Current medicines are reviewed at length with the patient today.  The patient does not have concerns regarding medicines.  The following changes have been made:  Decrease coreg to 25mg  BID.  Labs/ tests ordered today include:   Orders Placed This Encounter  Procedures  . Basic Metabolic Panel (BMET)  . TSH  . B Nat Peptide  . EKG 12-Lead     Disposition:   FU with Dr. Lovena Le in next month and Dr. Harrington Challenger as previously scheduled.    Renea Ee  06/14/2014 3:42 PM    [facility] [address], [place], [place]  [address] Phone: [phone]; Fax: [phone] [phone]   ]

## 2014-06-15 LAB — TSH: TSH: 0.655 u[IU]/mL (ref 0.350–4.500)

## 2014-06-15 LAB — BRAIN NATRIURETIC PEPTIDE: Brain Natriuretic Peptide: 94.1 pg/mL (ref 0.0–100.0)

## 2014-07-15 ENCOUNTER — Other Ambulatory Visit: Payer: Self-pay | Admitting: Internal Medicine

## 2014-08-05 ENCOUNTER — Ambulatory Visit: Payer: Managed Care, Other (non HMO) | Admitting: Internal Medicine

## 2014-08-06 ENCOUNTER — Encounter: Payer: Managed Care, Other (non HMO) | Admitting: Internal Medicine

## 2014-09-04 ENCOUNTER — Other Ambulatory Visit: Payer: Self-pay | Admitting: Cardiology

## 2014-09-04 NOTE — Telephone Encounter (Signed)
I thinks she is on 25 (1 tab) and 37.5 (1 1/2 tabs) per day

## 2014-09-04 NOTE — Telephone Encounter (Signed)
Per note 3.4.16 

## 2014-10-21 ENCOUNTER — Telehealth: Payer: Self-pay | Admitting: Internal Medicine

## 2014-10-22 ENCOUNTER — Telehealth: Payer: Self-pay | Admitting: Physician Assistant

## 2014-10-22 NOTE — Telephone Encounter (Signed)
Pt states she is having issues with abdominal and epigastric pain. Pt states she has not been able to eat normally. Pt scheduled to see Lori Hvozdovic, PA-C 10/23/14@2 :45pm. Pt aware of appt.

## 2014-10-23 ENCOUNTER — Ambulatory Visit: Payer: Managed Care, Other (non HMO) | Admitting: Physician Assistant

## 2014-10-23 NOTE — Telephone Encounter (Signed)
Ok to schedule with PA?

## 2014-10-24 ENCOUNTER — Ambulatory Visit (INDEPENDENT_AMBULATORY_CARE_PROVIDER_SITE_OTHER): Payer: Managed Care, Other (non HMO) | Admitting: Physician Assistant

## 2014-10-24 ENCOUNTER — Encounter: Payer: Self-pay | Admitting: Physician Assistant

## 2014-10-24 ENCOUNTER — Other Ambulatory Visit (INDEPENDENT_AMBULATORY_CARE_PROVIDER_SITE_OTHER): Payer: Managed Care, Other (non HMO)

## 2014-10-24 VITALS — BP 136/88 | HR 80 | Ht 64.0 in | Wt 254.0 lb

## 2014-10-24 DIAGNOSIS — R110 Nausea: Secondary | ICD-10-CM | POA: Diagnosis not present

## 2014-10-24 DIAGNOSIS — Z8719 Personal history of other diseases of the digestive system: Secondary | ICD-10-CM | POA: Diagnosis not present

## 2014-10-24 DIAGNOSIS — K219 Gastro-esophageal reflux disease without esophagitis: Secondary | ICD-10-CM

## 2014-10-24 DIAGNOSIS — R1032 Left lower quadrant pain: Secondary | ICD-10-CM

## 2014-10-24 LAB — COMPREHENSIVE METABOLIC PANEL
ALK PHOS: 45 U/L (ref 39–117)
ALT: 15 U/L (ref 0–35)
AST: 16 U/L (ref 0–37)
Albumin: 4 g/dL (ref 3.5–5.2)
BILIRUBIN TOTAL: 0.6 mg/dL (ref 0.2–1.2)
BUN: 16 mg/dL (ref 6–23)
CO2: 32 meq/L (ref 19–32)
CREATININE: 0.89 mg/dL (ref 0.40–1.20)
Calcium: 9.7 mg/dL (ref 8.4–10.5)
Chloride: 100 mEq/L (ref 96–112)
GFR: 68.62 mL/min (ref >60.00)
GLUCOSE: 113 mg/dL — AB (ref 70–99)
POTASSIUM: 3.9 meq/L (ref 3.5–5.1)
Sodium: 139 mEq/L (ref 135–145)
Total Protein: 8.1 g/dL (ref 6.0–8.3)

## 2014-10-24 LAB — CBC WITH DIFFERENTIAL/PLATELET
BASOS PCT: 0.6 % (ref 0.0–3.0)
Basophils Absolute: 0.1 10*3/uL (ref 0.0–0.1)
EOS ABS: 0.2 10*3/uL (ref 0.0–0.7)
Eosinophils Relative: 2.1 % (ref 0.0–5.0)
HCT: 44 % (ref 36.0–46.0)
HEMOGLOBIN: 14.7 g/dL (ref 12.0–15.0)
Lymphocytes Relative: 21.6 % (ref 12.0–46.0)
Lymphs Abs: 2.3 10*3/uL (ref 0.7–4.0)
MCHC: 33.3 g/dL (ref 30.0–36.0)
MCV: 88.4 fl (ref 78.0–100.0)
MONOS PCT: 6.2 % (ref 3.0–12.0)
Monocytes Absolute: 0.7 10*3/uL (ref 0.1–1.0)
Neutro Abs: 7.3 10*3/uL (ref 1.4–7.7)
Neutrophils Relative %: 69.5 % (ref 43.0–77.0)
Platelets: 283 10*3/uL (ref 150.0–400.0)
RBC: 4.97 Mil/uL (ref 3.87–5.11)
RDW: 13.5 % (ref 11.5–15.5)
WBC: 10.6 10*3/uL — ABNORMAL HIGH (ref 4.0–10.5)

## 2014-10-24 LAB — LIPASE: LIPASE: 14 U/L (ref 11.0–59.0)

## 2014-10-24 LAB — AMYLASE: AMYLASE: 27 U/L (ref 27–131)

## 2014-10-24 MED ORDER — PANTOPRAZOLE SODIUM 40 MG PO TBEC: 40.0000 mg | DELAYED_RELEASE_TABLET | Freq: Two times a day (BID) | ORAL | Status: AC

## 2014-10-24 MED ORDER — HYOSCYAMINE SULFATE 0.125 MG SL SUBL: 0.1250 mg | SUBLINGUAL_TABLET | Freq: Two times a day (BID) | SUBLINGUAL | Status: AC

## 2014-10-24 MED ORDER — ONDANSETRON 4 MG PO TBDP: 4.0000 mg | ORAL_TABLET | Freq: Three times a day (TID) | ORAL | Status: AC | PRN

## 2014-10-24 NOTE — Progress Notes (Addendum)
Patient ID: Whitney Jimenez, female   DOB: 1955-11-04, 59 y.o.   MRN: [id number]     History of Present Illness: Whitney Jimenez is a pleasant 59 year old female known to Dr. Hilarie Fredrickson. She has a past history significant for diverticulosis and is status post a sigmoid resection for diverticulitis. She has a history of H. pylori status post treatment, hereditary cardiomyopathy with ICD, hypertension, fibromyalgia, meningioma status post resection. She was evaluated in December 2015 with epigastric abdominal pain and it was felt that at the time she may have had gastritis due to use of non-steroidal anti-inflammatory drugs she was placed on twice a day pantoprazole and Carafate. Unfortunately she did not take the Carafate because it tasted bad but she did have resolution of her epigastric abdominal pain with twice daily use of pantoprazole. She was last seen in February at which time she was feeling better and her pantoprazole was decreased to once a day. She is here today because she is again having epigastric pain with waves of nausea. She has no vomiting. She is also having diffuse lower abdominal pain worse on the left than on the right. She is moving her bowels fairly regularly. She has had no fever or chills but she feels her left lower quadrant pain has been getting worse. It is not exacerbated with movement. It is somewhat worse after meals and is not alleviated with defecation. Her last EGD was in November 2004 at which time the mucosa of the esophagus appeared normal. Normal Z line was observed 39 cm from the incisor. Nodule was found on the lesser curvature of the gastric antrum and biopsy. Gastropathy was found in the cardia, gastric fundus, and gastric body and was biopsied. Duodenal inflammation was found at the duodenal bulb and was biopsied. Pathology showed benign duodenal mucosa with gastric full Beeler metaplasia variable villous blunting and nonspecific inflammation. Biopsies of the antral nodule  showed moderate chronic active H. pylori gastritis. She had a colonoscopy in 02/13/2013 at which time 3 sessile polyps were found in the ascending colon at the hepatic flexure and in the rectum polypectomy was performed 2 polyps were found to be adenomatous and the rectal polyp was found to be hyperplastic.   Past Medical History  Diagnosis Date  . Cardiomyopathy   . ICD (implantable cardiac defibrillator) in place     Medtronic ICD  . Fibromyalgia   . CHF (congestive heart failure)   . Heart murmur   . Hypertension   . Depression   . Diverticulosis   . Gallstones   . IBS (irritable bowel syndrome)   . Complication of anesthesia     12'82-Cardiac Arrest in OR"positional LBBB"  . History of Helicobacter pylori infection   . Gastritis   . Dyspnea on exertion   . Colon polyps     adenomatous  . GERD (gastroesophageal reflux disease)   . Enterocolitis   . Diverticulosis   . Tubular adenoma of colon     Past Surgical History  Procedure Laterality Date  . Abdominal hysterectomy    . Colectomy  2001    sigmoid  . Cholecystectomy      '82  . Cardiac catheterization  1983    last 2010,-[place]  . Brain surgery  2004    "meningioma"Tumor-retained Titanium plate to cover skull  . Icd implanted Left     Medtronic  . Esophagogastroduodenoscopy (egd) with propofol N/A 02/13/2013    Procedure: ESOPHAGOGASTRODUODENOSCOPY (EGD) WITH PROPOFOL;  Surgeon: Jerene Bears, MD;  Location:  [facility];  Service: Gastroenterology;  Laterality: N/A;  . Colonoscopy with propofol N/A 02/13/2013    Procedure: COLONOSCOPY WITH PROPOFOL;  Surgeon: Jerene Bears, MD;  Location: [facility];  Service: Gastroenterology;  Laterality: N/A;   Family History  Problem Relation Age of Onset  . Diabetes Mother   . Diabetes Maternal Grandmother   . Heart disease Mother   . Heart disease Father   . Heart disease Paternal Grandfather   . Heart disease Paternal Grandmother   . Heart disease Maternal  Grandmother   . Lung cancer Paternal Grandmother   . Colon cancer Neg Hx   . Melanoma Other     cousin  . Melanoma Brother    History  Substance Use Topics  . Smoking status: Never Smoker   . Smokeless tobacco: Never Used  . Alcohol Use: Yes     Comment: social- wine with dinner   Current Outpatient Prescriptions  Medication Sig Dispense Refill  . carvedilol (COREG) 25 MG tablet One tablet by mouth twice daily 75 tablet 11  . clobetasol ointment (TEMOVATE) 6.75 % Apply 1 application topically 2 (two) times daily as needed (rash). As needed    . desonide (DESOWEN) 0.05 % ointment Apply 1 application topically 2 (two) times daily as needed (rash). As needed    . DULoxetine (CYMBALTA) 60 MG capsule Take 60 mg by mouth daily. PT TAKES NAME BRAND ONLY FOR CYMBALTA    . fluconazole (DIFLUCAN) 150 MG tablet Take 150 mg by mouth as needed. Take one tablet po    . fluocinonide ointment (LIDEX) 9.16 % Apply 1 application topically 2 (two) times daily as needed (rash). Solution as needed    . furosemide (LASIX) 20 MG tablet Take 20-40 mg by mouth daily.    Marland Kitchen losartan (COZAAR) 100 MG tablet TAKE 1 TABLET BY MOUTH EVERY DAY 30 tablet 1  . metroNIDAZOLE (FLAGYL) 500 MG tablet Take 1 tablet (500 mg total) by mouth 3 (three) times daily. 21 tablet 0  . NIFEDICAL XL 30 MG 24 hr tablet TAKE 1 TABLET BY MOUTH TWICE DAILY 60 tablet 3  . ondansetron (ZOFRAN ODT) 4 MG disintegrating tablet Take 1 tablet (4 mg total) by mouth 3 (three) times daily as needed for nausea or vomiting. 30 tablet 1  . pantoprazole (PROTONIX) 40 MG tablet Take 1 tablet (40 mg total) by mouth 2 (two) times daily. 180 tablet 1  . spironolactone (ALDACTONE) 25 MG tablet Take 50 mg by mouth daily.     . hyoscyamine (LEVSIN/SL) 0.125 MG SL tablet Place 1 tablet (0.125 mg total) under the tongue 2 (two) times daily. 60 tablet 1   No current facility-administered medications for this visit.   Allergies  Allergen Reactions  . Codeine  Nausea And Vomiting  . Morphine And Related Itching and Nausea And Vomiting  . Snail Extract Nausea And Vomiting  . Penicillins Rash      Review of Systems: Gen: Denies any fever, chills, sweats, anorexia, fatigue, weakness, malaise, weight loss, and sleep disorder CV: Denies chest pain, angina, palpitations, syncope, orthopnea, PND, peripheral edema, and claudication. Resp: Denies dyspnea at rest, dyspnea with exercise, cough, sputum, wheezing, coughing up blood, and pleurisy. GI: Denies vomiting blood, jaundice, and fecal incontinence.   Denies dysphagia or odynophagia. GU : Denies urinary burning, blood in urine, urinary frequency, urinary hesitancy, nocturnal urination, and urinary incontinence. MS: Denies joint pain, limitation of movement, and swelling, stiffness, low back pain, extremity pain. Denies muscle weakness, cramps,  atrophy.  Derm: Denies rash, itching, dry skin, hives, moles, warts, or unhealing ulcers.  Psych: Denies depression, anxiety, memory loss, suicidal ideation, hallucinations, paranoia, and confusion. Heme: Denies bruising, bleeding, and enlarged lymph nodes. Neuro:  Denies any headaches, dizziness, paresthesia Endo:  Denies any problems with DM, thyroid, adrenal   Physical Exam: General: Pleasant, well developed female in no acute distress Head: Normocephalic and atraumatic Eyes:  sclerae anicteric, conjunctiva pink  Ears: Normal auditory acuity Lungs: Clear throughout to auscultation Heart: Regular rate and rhythm Abdomen: Soft, non distended, tender left lower quadrant and suprapubic area with guarding but no rebound, No masses, no hepatomegaly. Normal bowel sounds Musculoskeletal: Symmetrical with no gross deformities  Extremities: No edema  Neurological: Alert oriented x 4, grossly nonfocal Psychological:  Alert and cooperative. Normal mood and affect  Assessment and Recommendations: #1. Epigastric pain. Patient has a history of gastritis. We will  increase her pantoprazole back to 40 mg twice daily.  #2. Left lower quadrant and suprapubic pain of a weeks' duration that is becoming more severe. Given her history of enterocolitis and diverticulitis requiring resection, will repeat CT scan of the abdomen and pelvis to evaluate for recurrent diverticulitis, abscess, etc. Will obtain a CBC, comprehensive metabolic panel, amylase and lipase. Patient will use Zofran 4 mg 1 by mouth 3 times a day when necessary. She will adhere to a low fat low residue diet. She will use Levsin 0.125 mg 1 by mouth twice daily when necessary cramping. Further recommendations will be made pending the findings of the above.   Maisie Hauser, Deloris Ping 10/24/2014,  Addendum: Reviewed and agree with initial management. Jerene Bears, MD

## 2014-10-24 NOTE — Patient Instructions (Addendum)
Your physician has requested that you go to the basement for lab work before leaving today.  Increase pantoprazole to 76m twice daily.  We have sent the following medications to your pharmacy for you to pick up at your convenience:  Zofran 448m take 1 tablet three times a day as needed for nausea.  Levsin 0.12554make twice daily.  You have been scheduled for a CT [facility] of the abdomen and pelvis [email or web address]this is in the same building as [facility]  You are scheduled on 10/25/2014 at 2pm. You should arrive 15 minutes prior to your appointment time for registration. Please follow the written instructions below on the day of your exam:  WARNING: IF YOU ARE ALLERGIC TO IODINE/X-RAY DYE, PLEASE NOTIFY RADIOLOGY IMMEDIATELY AT [phone] WILL BE GIVEN A 13 HOUR PREMEDICATION PREP.  1) Do not eat or drink anything after 11am (4 hours prior to your test) 2) You have been given 2 bottles of oral contrast to drink. The solution may taste better if refrigerated, but do NOT add ice or any other liquid to this solution. Shake well before drinking.    Drink 1 bottle of contrast @ 12 noon (2 hours prior to your exam)  Drink 1 bottle of contrast @ 1pm (1 hour prior to your exam)  You may take any medications as prescribed with a small amount of water except for the following: Metformin, Glucophage, Glucovance, Avandamet, Riomet, Fortamet, Actoplus Met, Janumet, Glumetza or Metaglip. The above medications must be held the day of the exam AND 48 hours after the exam.  The purpose of you drinking the oral contrast is to aid in the visualization of your intestinal tract. The contrast solution may cause some diarrhea. Before your exam is started, you will be given a small amount of fluid to drink. Depending on your individual set of symptoms, you may also receive an intravenous injection of x-ray contrast/dye. [facility]  or long, depending on the type of exam you are having performed.  This test typically takes 30-45 minutes to complete.  If you have any questions regarding your exam or if you need to reschedule, you may call the CT department at [phone] the hours of 8:00 am and 5:00 pm, Monday-Friday.  ________________________________________________________________________  Low-Fiber Diet Fiber is found in fruits, vegetables, and whole grains. A low-fiber diet restricts fibrous foods that are not digested in the small intestine. A diet containing about 10-15 grams of fiber per day is considered low fiber. Low-fiber diets may be used to:  Promote healing and rest the bowel during intestinal flare-ups.  Prevent blockage of a partially obstructed or narrowed gastrointestinal tract.  Reduce fecal weight and volume.  Slow the movement of feces. You may be on a low-fiber diet as a transitional diet following surgery, after an injury (trauma), or because of a short (acute) or lifelong (chronic) illness. Your health care provider will determine the length of time you need to stay on this diet.  WHAT DO I NEED TO KNOW ABOUT A LOW-FIBER DIET? Always check the fiber content on the packaging's Nutrition Facts label, especially on foods from the grains list. Ask your dietitian if you have questions about specific foods that are related to your condition, especially if the food is not listed below. In general, a low-fiber food will have less than 2 g of fiber. WHAT FOODS CAN I EAT? Grains All breads and  crackers made with white flour. Sweet rolls, doughnuts, waffles, pancakes, [place] toast, bagels. Pretzels, Melba toast, zwieback. Well-cooked cereals, such as cornmeal, farina, or cream cereals. Dry cereals that do not contain whole grains, fruit, or nuts, such as refined corn, wheat, rice, and oat cereals. Potatoes prepared any way without skins, plain pastas and noodles, refined white rice. Use white flour  for baking and making sauces. Use allowed list of grains for casseroles, dumplings, and puddings.  Vegetables Strained tomato and vegetable juices. Fresh lettuce, cucumber, spinach. Well-cooked (no skin or pulp) or canned vegetables, such as asparagus, bean sprouts, beets, carrots, green beans, mushrooms, potatoes, pumpkin, spinach, yellow squash, tomato sauce/puree, turnips, yams, and zucchini. Keep servings limited to  cup.  Fruits All fruit juices except prune juice. Cooked or canned fruits without skin and seeds, such as applesauce, apricots, cherries, fruit cocktail, grapefruit, grapes, mandarin oranges, melons, peaches, pears, pineapple, and plums. Fresh fruits without skin, such as apricots, avocados, bananas, melons, pineapple, nectarines, and peaches. Keep servings limited to  cup or 1 piece.  Meat and Other Protein Sources Ground or well-cooked tender beef, ham, veal, lamb, pork, or poultry. Eggs, plain cheese. Fish, oysters, shrimp, lobster, and other seafood. Liver, organ meats. Smooth nut butters. Dairy All milk products and alternative dairy substitutes, such as soy, rice, almond, and coconut, not containing added whole nuts, seeds, or added fruit. Beverages Decaf coffee, fruit, and vegetable juices or smoothies (small amounts, with no pulp or skins, and with fruits from allowed list), sports drinks, herbal tea. Condiments Ketchup, mustard, vinegar, cream sauce, cheese sauce, cocoa powder. Spices in moderation, such as allspice, basil, bay leaves, celery powder or leaves, cinnamon, cumin powder, curry powder, ginger, mace, marjoram, onion or garlic powder, oregano, paprika, parsley flakes, ground pepper, rosemary, sage, savory, tarragon, thyme, and turmeric. Sweets and Desserts Plain cakes and cookies, pie made with allowed fruit, pudding, custard, cream pie. Gelatin, fruit, ice, sherbet, frozen ice pops. Ice cream, ice milk without nuts. Plain hard candy, honey, jelly, molasses,  syrup, sugar, chocolate syrup, gumdrops, marshmallows. Limit overall sugar intake.  Fats and Oil Margarine, butter, cream, mayonnaise, salad oils, plain salad dressings made from allowed foods. Choose healthy fats such as olive oil, canola oil, and omega-3 fatty acids (such as found in salmon or tuna) when possible.  Other Bouillon, broth, or cream soups made from allowed foods. Any strained soup. Casseroles or mixed dishes made with allowed foods. The items listed above may not be a complete list of recommended foods or beverages. Contact your dietitian for more options.  WHAT FOODS ARE NOT RECOMMENDED? Grains All whole wheat and whole grain breads and crackers. Multigrains, rye, bran seeds, nuts, or coconut. Cereals containing whole grains, multigrains, bran, coconut, nuts, raisins. Cooked or dry oatmeal, steel-cut oats. Coarse wheat cereals, granola. Cereals advertised as high fiber. Potato skins. Whole grain pasta, wild or brown rice. Popcorn. Coconut flour. Bran, buckwheat, corn bread, multigrains, rye, wheat germ.  Vegetables Fresh, cooked or canned vegetables, such as artichokes, asparagus, beet greens, broccoli, Brussels sprouts, cabbage, celery, cauliflower, corn, eggplant, kale, legumes or beans, okra, peas, and tomatoes. Avoid large servings of any vegetables, especially raw vegetables.  Fruits Fresh fruits, such as apples with or without skin, berries, cherries, figs, grapes, grapefruit, guavas, kiwis, mangoes, oranges, papayas, pears, persimmons, pineapple, and pomegranate. Prune juice and juices with pulp, stewed or dried prunes. Dried fruits, dates, raisins. Fruit seeds or skins. Avoid large servings of all fresh fruits. Meats and Other Protein Sources  Tough, fibrous meats with gristle. Chunky nut butter. Cheese made with seeds, nuts, or other foods not recommended. Nuts, seeds, legumes (beans, including baked beans), dried peas, beans, lentils.  Dairy Yogurt or cheese that contains  nuts, seeds, or added fruit.  Beverages Fruit juices with high pulp, prune juice. Caffeinated coffee and teas.  Condiments Coconut, maple syrup, pickles, olives. Sweets and Desserts Desserts, cookies, or candies that contain nuts or coconut, chunky peanut butter, dried fruits. Jams, preserves with seeds, marmalade. Large amounts of sugar and sweets. Any other dessert made with fruits from the not recommended list.  Other Soups made from vegetables that are not recommended or that contain other foods not recommended.  The items listed above may not be a complete list of foods and beverages to avoid. Contact your dietitian for more information. Document Released: 09/18/2001 Document Revised: 04/03/2013 Document Reviewed: 02/19/2013 [facility] Patient Information 2015 [place], [place]. This information is not intended to replace advice given to you by your health care provider. Make sure you discuss any questions you have with your health care provider.

## 2014-10-25 ENCOUNTER — Ambulatory Visit (INDEPENDENT_AMBULATORY_CARE_PROVIDER_SITE_OTHER)
Admission: RE | Admit: 2014-10-25 | Discharge: 2014-10-25 | Disposition: A | Discharge status: Home / Self Care | Payer: Managed Care, Other (non HMO) | Source: Ambulatory Visit | Attending: Physician Assistant | Admitting: Physician Assistant

## 2014-10-25 DIAGNOSIS — R1032 Left lower quadrant pain: Secondary | ICD-10-CM

## 2014-10-25 DIAGNOSIS — Z8719 Personal history of other diseases of the digestive system: Secondary | ICD-10-CM

## 2014-10-25 MED ORDER — IOHEXOL 300 MG/ML  SOLN
100.0000 mL | Freq: Once | INTRAMUSCULAR | Status: AC | PRN
  Administered 2014-10-25: 100 mL via INTRAVENOUS

## 2014-11-02 ENCOUNTER — Other Ambulatory Visit: Payer: Self-pay | Admitting: Cardiology

## 2014-11-05 ENCOUNTER — Other Ambulatory Visit: Payer: Self-pay

## 2014-11-09 ENCOUNTER — Other Ambulatory Visit: Payer: Self-pay | Admitting: Internal Medicine

## 2014-11-12 ENCOUNTER — Other Ambulatory Visit: Payer: Self-pay | Admitting: Internal Medicine

## 2014-12-07 ENCOUNTER — Other Ambulatory Visit: Payer: Self-pay | Admitting: Cardiology

## 2014-12-27 ENCOUNTER — Encounter: Payer: Self-pay | Admitting: General Practice

## 2014-12-27 ENCOUNTER — Telehealth: Payer: Self-pay | Admitting: General Practice

## 2014-12-27 NOTE — Telephone Encounter (Signed)
Attempted to call patient to update mammogram information but unable to reach patient at either number.  Sent My Chart message.

## 2015-06-15 ENCOUNTER — Other Ambulatory Visit: Payer: Self-pay | Admitting: Cardiology

## 2015-07-22 ENCOUNTER — Other Ambulatory Visit: Payer: Self-pay | Admitting: Internal Medicine

## 2015-08-09 ENCOUNTER — Other Ambulatory Visit: Payer: Self-pay | Admitting: Internal Medicine

## 2015-08-27 ENCOUNTER — Other Ambulatory Visit: Payer: Self-pay | Admitting: Internal Medicine

## 2015-09-26 ENCOUNTER — Other Ambulatory Visit: Payer: Self-pay | Admitting: Internal Medicine

## 2015-11-16 ENCOUNTER — Other Ambulatory Visit: Payer: Self-pay | Admitting: Internal Medicine

## 2015-11-16 IMAGING — CT CT ABD-PELV W/ CM
2 of 5 series · 17 of 46 positions shown, 19 images · IV contrast (Omnipaque 300)
Comparison: CT scan dated 01/01/2013

CLINICAL DATA: Left lower quadrant tenderness hand pain. History of
diverticulitis. The sigmoid colon resection in 7882.

EXAM:
CT ABDOMEN AND PELVIS WITH CONTRAST
TECHNIQUE: Multidetector CT imaging of the abdomen and pelvis was performed
using the standard protocol following bolus administration of
intravenous contrast.
CONTRAST:  100mL OMNIPAQUE IOHEXOL 300 MG/ML  SOLN

[Series 2: abd/ pel 5mm · axial · 0.84mm/px · z∈[-447,-52]mm · 14 of 89 slices shown, 16 images]
[im 5/89  soft-tissue]
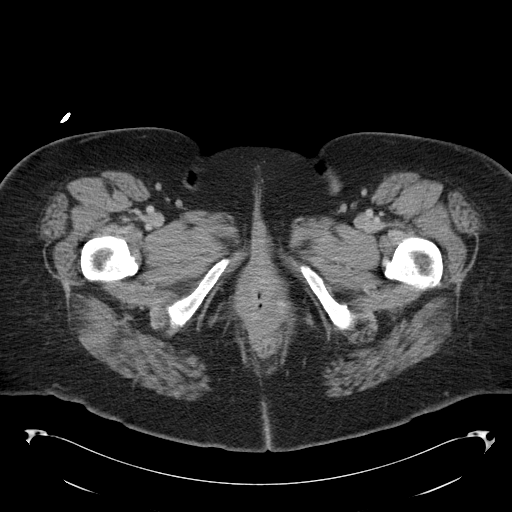
[im 5/89  bone]
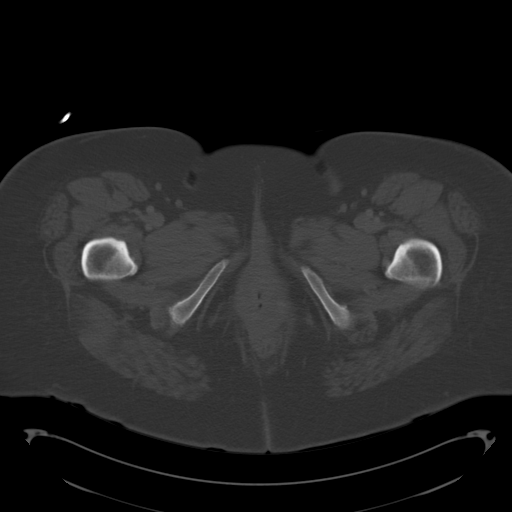
[im 10/89  soft-tissue]
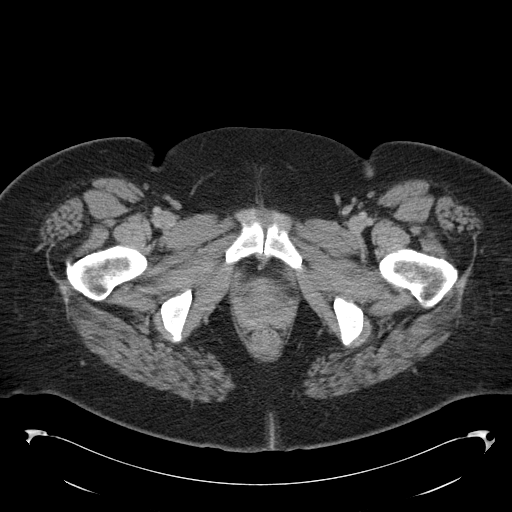
[im 19/89  soft-tissue]
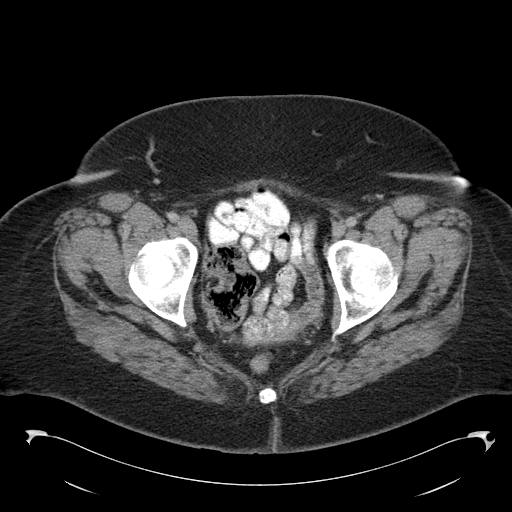
[im 24/89  soft-tissue]
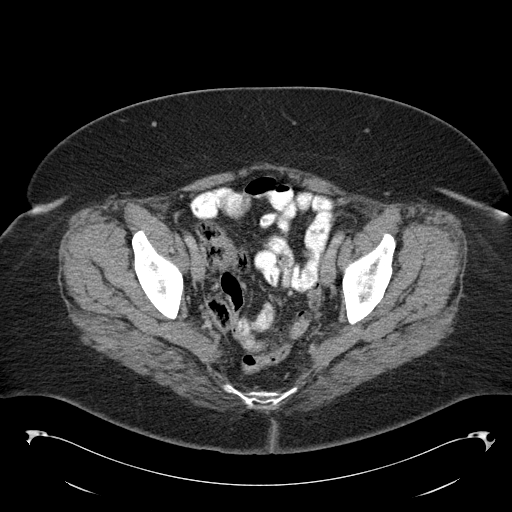
[im 28/89  soft-tissue]
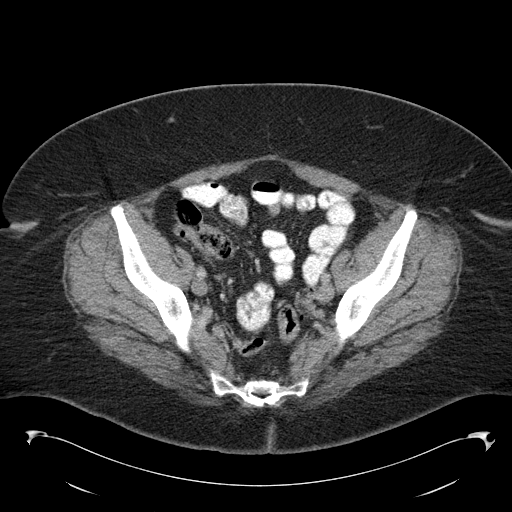
[im 38/89  soft-tissue]
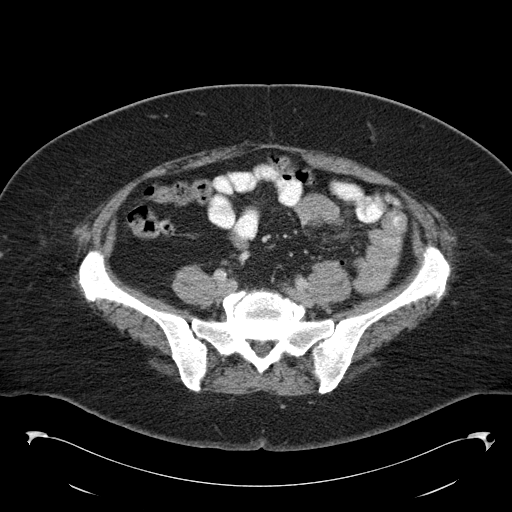
[im 42/89  soft-tissue]
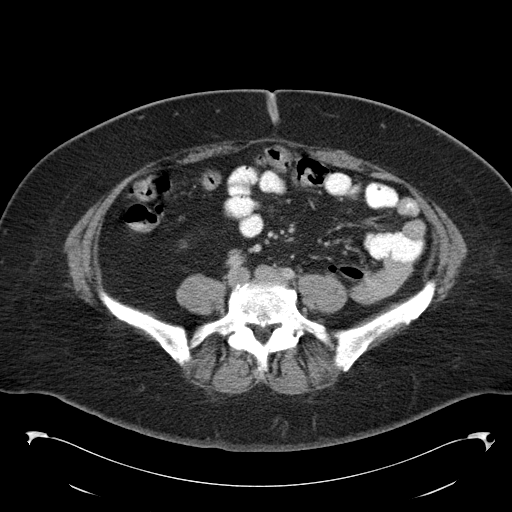
[im 47/89  soft-tissue]
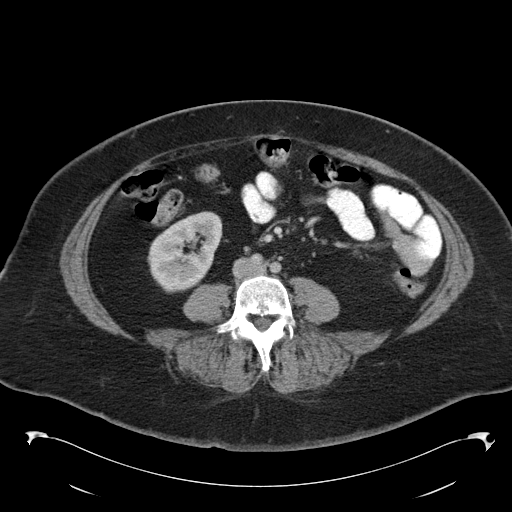
[im 51/89  soft-tissue]
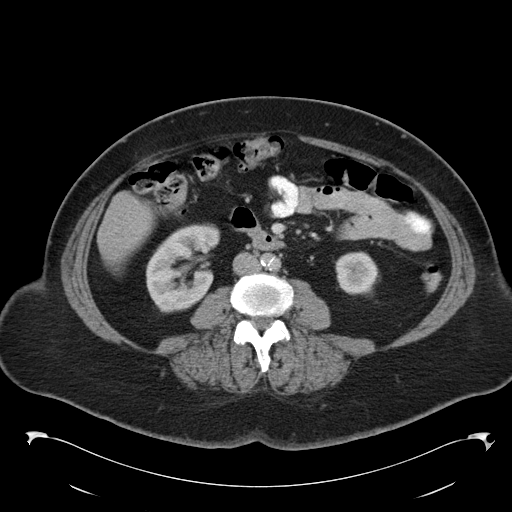
[im 51/89  bone]
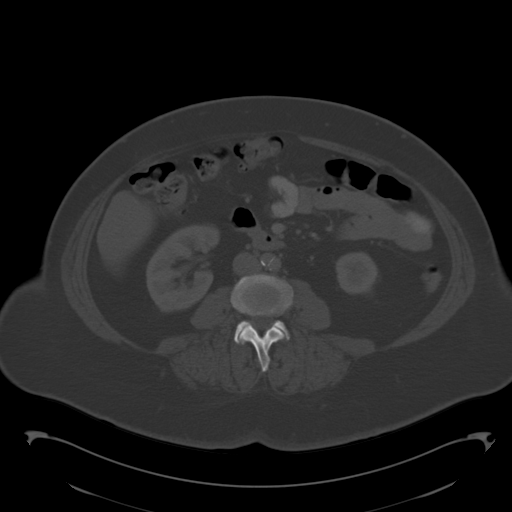
[im 61/89  soft-tissue]
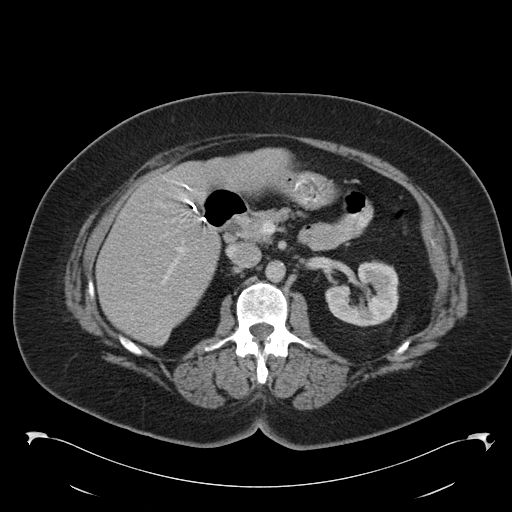
[im 65/89  soft-tissue]
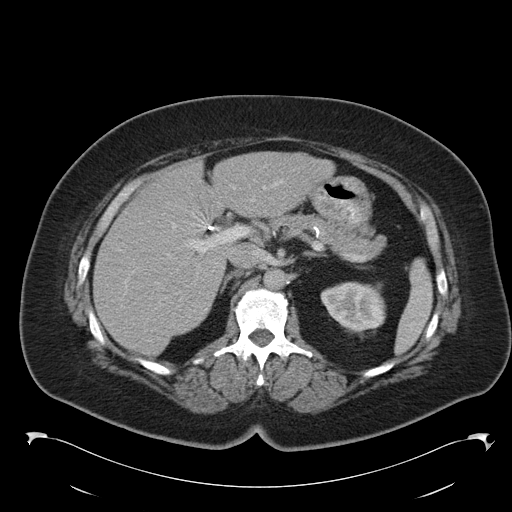
[im 70/89  soft-tissue]
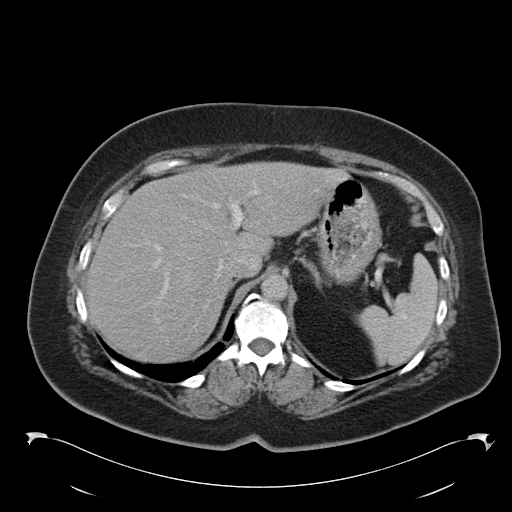
[im 79/89  soft-tissue]
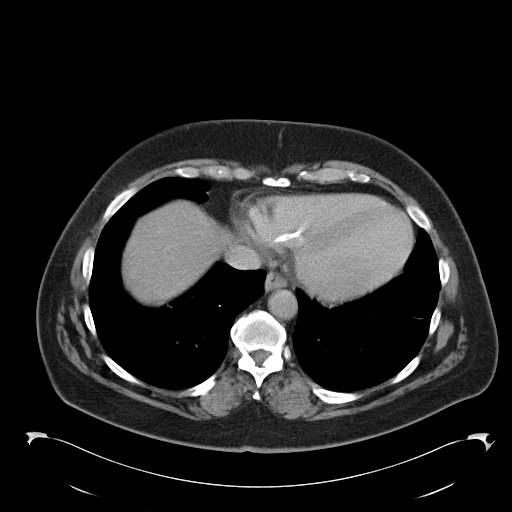
[im 84/89  soft-tissue]
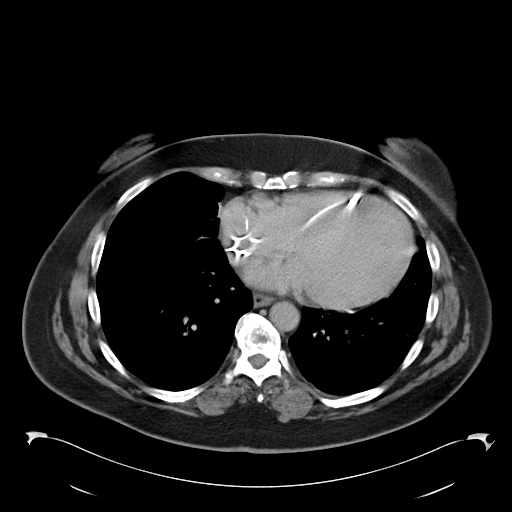

[Series 602: cor · coronal · 0.89mm/px · 3 of 134 slices shown]
[im 45/134  soft-tissue]
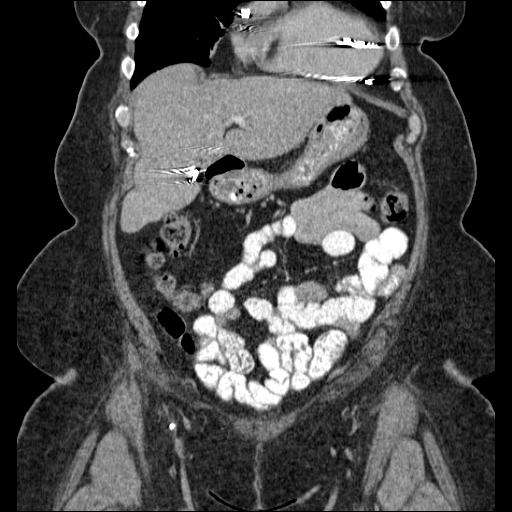
[im 60/134  soft-tissue]
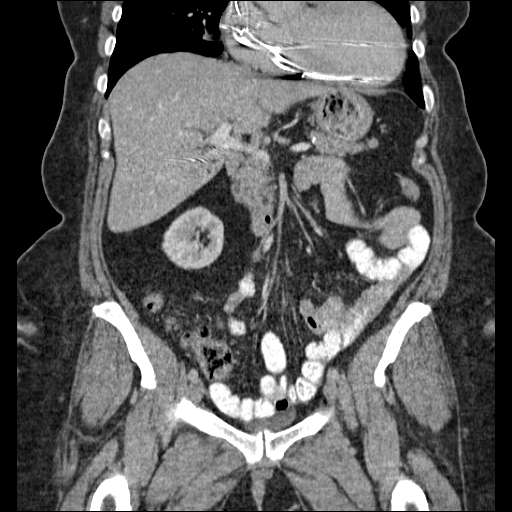
[im 74/134  soft-tissue]
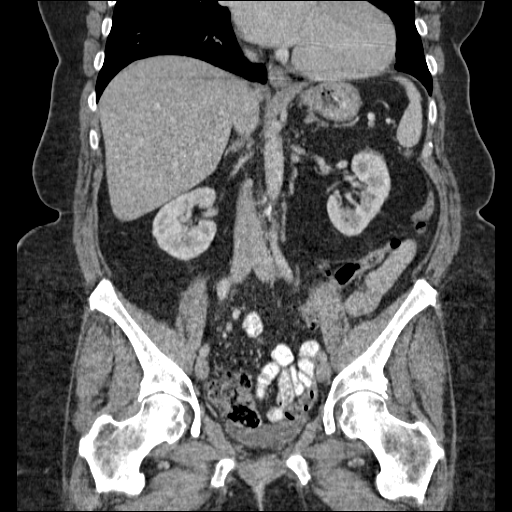

[17 of 46 positions shown; findings below may reference images not displayed]

FINDINGS: Lower chest: Chronic cardiomegaly. Stable slight scarring of both
lung bases.

Hepatobiliary: Cholecystectomy.  Otherwise normal.

Pancreas: Normal.

Spleen: Normal.

Adrenals/Urinary Tract: Stable 7 mm cyst in the upper pole of the
right kidney. 3 mm cyst in the anterior aspect of the mid left
kidney. Adrenal glands and kidneys and ureters and bladder are
otherwise normal.

Stomach/Bowel: There are scattered diverticula in the colon. No
diverticulitis. The cecum lies low in the right side of the pelvis.
Normal appearing terminal ileum and appendix. The rest of the bowel
is normal.

Vascular/Lymphatic: Minimal atherosclerosis of the abdominal aorta
and common iliac arteries. No adenopathy.

Reproductive: Uterus and ovaries have been removed.

Other: No free air.  No free fluid.

Musculoskeletal: Chronic degenerative disc disease at L4-5 and L5-S1
with a chronic calcified disc protrusion central and to the left at
L4-5, unchanged.
IMPRESSION: No acute abnormalities.  Scattered diverticula in the colon.

## 2015-11-24 ENCOUNTER — Encounter: Payer: Self-pay | Admitting: *Deleted

## 2015-12-17 ENCOUNTER — Ambulatory Visit: Payer: Managed Care, Other (non HMO) | Admitting: Internal Medicine

## 2016-05-21 DIAGNOSIS — C679 Malignant neoplasm of bladder, unspecified: Secondary | ICD-10-CM

## 2016-05-21 HISTORY — DX: Malignant neoplasm of bladder, unspecified: C67.9

## 2016-08-06 ENCOUNTER — Telehealth: Payer: Self-pay | Admitting: Internal Medicine

## 2016-08-09 NOTE — Telephone Encounter (Signed)
The pt has been scheduled with Dr Hilarie Fredrickson to discuss concerns regarding bladder cancer and possible GI concerns.

## 2016-09-22 ENCOUNTER — Ambulatory Visit (INDEPENDENT_AMBULATORY_CARE_PROVIDER_SITE_OTHER): Payer: Managed Care, Other (non HMO) | Admitting: Internal Medicine

## 2016-09-22 ENCOUNTER — Encounter: Payer: Self-pay | Admitting: Internal Medicine

## 2016-09-22 VITALS — BP 148/70 | HR 64 | Ht 64.0 in | Wt 272.0 lb

## 2016-09-22 DIAGNOSIS — R1013 Epigastric pain: Secondary | ICD-10-CM

## 2016-09-22 DIAGNOSIS — Z86010 Personal history of colonic polyps: Secondary | ICD-10-CM | POA: Diagnosis not present

## 2016-09-22 DIAGNOSIS — Z8619 Personal history of other infectious and parasitic diseases: Secondary | ICD-10-CM

## 2016-09-22 DIAGNOSIS — C679 Malignant neoplasm of bladder, unspecified: Secondary | ICD-10-CM

## 2016-09-22 MED ORDER — NA SULFATE-K SULFATE-MG SULF 17.5-3.13-1.6 GM/177ML PO SOLN: ORAL | 0 refills | Status: DC

## 2016-09-22 NOTE — Progress Notes (Signed)
Subjective:    Patient ID: Whitney Jimenez, female    DOB: [birth year], 61 y.o.   MRN: [id number]  HPI Whitney Jimenez is a 61 year old female with a past medical history of GERD, gastritis secondary to H. pylori, colon polyps, colonic diverticulosis status post sigmoid resection who is here for follow-up. She has subsequently moved to [place] but came to [place] for this visit. 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  She reports that recently she has been having more issues with epigastric abdominal pain. This can radiate across the upper abdomen. It is often associated with eating. There is no associated nausea or vomiting. Overall appetite has been good. Bowel movements have been mostly regular for her and can occur 2-3 times per day. Since she has been traveling she has been slightly more constipated. She denies blood in her stool and melena. She occasionally has some left lower quadrant abdominal discomfort when her bowels are slightly more constipated. This has not persisted. She has not needed recent antibiotic treatment. She has noticed very dry skin recently worse since BCG therapy.  She has remained on pantoprazole at 40 mg once daily. She reports her upper abdominal pain feels similar to when she had H. pylori. Over a year ago her primary care provider in [place] checked what sounds like an H. pylori stool antigen which was negative.  She was last seen in the office on 10/24/2014 by Cecille Rubin Hvozdovic, PA-C. There this time she was having epigastric pain but also left lower quadrant and suprapubic pain. CT scan was performed. CT abdomen on 10/25/2014 showed no acute abnormalities. There was scattered diverticula in the colon but no diverticulitis. There is chronic degenerative disc disease in the lower  lumbar spine.  Review of Systems As per history of present illness, otherwise negative  Current Medications, Allergies, Past Medical History, Past Surgical History, Family History and Social History were reviewed in [employer] record.     Objective:   Physical Exam BP (!) 148/70   Pulse 64   Ht 5\' 4"  (1.626 m)   Wt 272 lb (123.4 kg)   BMI 46.69 kg/m  Constitutional: Well-developed and well-nourished. No distress. HEENT: Normocephalic and atraumatic.   Conjunctivae are normal.  No scleral icterus. Neck: Neck supple. Trachea midline. Cardiovascular: Normal rate, regular rhythm and intact distal pulses. No M/R/G Pulmonary/chest: Effort normal and breath sounds normal. No wheezing, rales or rhonchi. Abdominal: Soft, Epigastric tenderness without rebound or guarding, obese , nondistended. Bowel sounds active throughout. There are no masses palpable.   Extremities: no clubbing, cyanosis, or edema Neurological: Alert and oriented to person place and time. Skin: Skin is warm and dry. Psychiatric: Normal mood and affect. Behavior is normal.     Assessment & Plan:  61 year old female with a past medical history of GERD, gastritis secondary to H. pylori, colon polyps, colonic diverticulosis status post sigmoid resection who is here for follow-up.   1. Epigastric pain/history of H. Pylori -- having ongoing epigastric abdominal pain worse with eating despite daily PPI. For this reason I recommended we repeat upper endoscopy. We discussed the risk benefits and alternatives and she is agreeable and wishes to proceed. She is currently here from [place] and fortunately we were able to add procedure on for tomorrow.  2. History of colon polyps -- she has a history of  2 small tubular adenomas of the colon. Her last colonoscopy was performed on 02/13/2013. She would be due for this repeat surveillance exam neck year however given that she is now living in [place] she would  prefer this test be performed while here. Also given her recent diagnosis of cancer she is seeking reassurance that she has not developed an advanced colon polyp or cancer. I reassured her that this is unlikely. After discussion and with her request for repeat surveillance procedure, we discussed the risks, benefits and alternatives, and will proceed with colonoscopy tomorrow after her upper endoscopy. This will suffice for her surveillance exam previously scheduled for 2019.  3. Bladder cancer -- being followed and monitored closely by her urologist in [place]  25 minutes spent with the patient today. Greater than 50% was spent in counseling and coordination of care with the patient

## 2016-09-22 NOTE — Patient Instructions (Signed)
You have been scheduled for an endoscopy and colonoscopy. Please follow the written instructions given to you at your visit today. Please pick up your prep supplies at the pharmacy within the next 1-3 days. If you use inhalers (even only as needed), please bring them with you on the day of your procedure. Your physician has requested that you go to www.startemmi.com and enter the access code given to you at your visit today. This web site gives a general overview about your procedure. However, you should still follow specific instructions given to you by our office regarding your preparation for the procedure.  If you are age 21 or older, your body mass index should be between 23-30. Your Body mass index is 46.69 kg/m. If this is out of the aforementioned range listed, please consider follow up with your Primary Care Provider.  If you are age 73 or younger, your body mass index should be between 19-25. Your Body mass index is 46.69 kg/m. If this is out of the aformentioned range listed, please consider follow up with your Primary Care Provider.

## 2016-09-23 ENCOUNTER — Ambulatory Visit (AMBULATORY_SURGERY_CENTER): Payer: Managed Care, Other (non HMO) | Admitting: Internal Medicine

## 2016-09-23 ENCOUNTER — Encounter: Payer: Self-pay | Admitting: Internal Medicine

## 2016-09-23 VITALS — BP 171/78 | HR 63 | Temp 98.7°F | Resp 23 | Ht 64.0 in | Wt 272.0 lb

## 2016-09-23 DIAGNOSIS — D122 Benign neoplasm of ascending colon: Secondary | ICD-10-CM | POA: Diagnosis not present

## 2016-09-23 DIAGNOSIS — D120 Benign neoplasm of cecum: Secondary | ICD-10-CM

## 2016-09-23 DIAGNOSIS — R1013 Epigastric pain: Secondary | ICD-10-CM | POA: Diagnosis present

## 2016-09-23 DIAGNOSIS — K319 Disease of stomach and duodenum, unspecified: Secondary | ICD-10-CM | POA: Diagnosis not present

## 2016-09-23 DIAGNOSIS — Z86010 Personal history of colonic polyps: Secondary | ICD-10-CM | POA: Diagnosis not present

## 2016-09-23 DIAGNOSIS — K2950 Unspecified chronic gastritis without bleeding: Secondary | ICD-10-CM

## 2016-09-23 MED ORDER — SODIUM CHLORIDE 0.9 % IV SOLN: 500.0000 mL | INTRAVENOUS | Status: AC

## 2016-09-23 NOTE — Patient Instructions (Signed)
Discharge instructions given. Handouts on polyp,diverticulosis,hemorrhoids,hiatal hernia and gastritis. Resume previous medications. YOU HAD AN ENDOSCOPIC PROCEDURE TODAY AT [facility]:   Refer to the procedure report that was given to you for any specific questions about what was found during the examination.  If the procedure report does not answer your questions, please call your gastroenterologist to clarify.  If you requested that your care partner not be given the details of your procedure findings, then the procedure report has been included in a sealed envelope for you to review at your convenience later.  YOU SHOULD EXPECT: Some feelings of bloating in the abdomen. Passage of more gas than usual.  Walking can help get rid of the air that was put into your GI tract during the procedure and reduce the bloating. If you had a lower endoscopy (such as a colonoscopy or flexible sigmoidoscopy) you may notice spotting of blood in your stool or on the toilet paper. If you underwent a bowel prep for your procedure, you may not have a normal bowel movement for a few days.  Please Note:  You might notice some irritation and congestion in your nose or some drainage.  This is from the oxygen used during your procedure.  There is no need for concern and it should clear up in a day or so.  SYMPTOMS TO REPORT IMMEDIATELY:   Following lower endoscopy (colonoscopy or flexible sigmoidoscopy):  Excessive amounts of blood in the stool  Significant tenderness or worsening of abdominal pains  Swelling of the abdomen that is new, acute  Fever of 100F or higher   Following upper endoscopy (EGD)  Vomiting of blood or coffee ground material  New chest pain or pain under the shoulder blades  Painful or persistently difficult swallowing  New shortness of breath  Fever of 100F or higher  Black, tarry-looking stools  For urgent or emergent issues, a gastroenterologist can be reached at any  hour by calling [phone].   DIET:  We do recommend a small meal at first, but then you may proceed to your regular diet.  Drink plenty of fluids but you should avoid alcoholic beverages for 24 hours.  ACTIVITY:  You should plan to take it easy for the rest of today and you should NOT DRIVE or use heavy machinery until tomorrow (because of the sedation medicines used during the test).    FOLLOW UP: Our staff will call the number listed on your records the next business day following your procedure to check on you and address any questions or concerns that you may have regarding the information given to you following your procedure. If we do not reach you, we will leave a message.  However, if you are feeling well and you are not experiencing any problems, there is no need to return our call.  We will assume that you have returned to your regular daily activities without incident.  If any biopsies were taken you will be contacted by phone or by letter within the next 1-3 weeks.  Please call us at [phone] if you have not heard about the biopsies in 3 weeks.    SIGNATURES/CONFIDENTIALITY: You and/or your care partner have signed paperwork which will be entered into your electronic medical record.  These signatures attest to the fact that that the information above on your After Visit Summary has been reviewed and is understood.  Full responsibility of the confidentiality of this discharge information lies with you and/or your care-partner.

## 2016-09-23 NOTE — Op Note (Signed)
[facility] Patient Name: Whitney Jimenez Procedure Date: 09/23/2016 2:47 PM MRN: [id number] Endoscopist: Jerene Bears , MD Age: 61 Referring MD:  Date of Birth: [birth year] Gender: Female Account #: [id number] Procedure:                Colonoscopy Indications:              High risk colon cancer surveillance: Personal                            history of colonic polyps, Last colonoscopy:                            November 2014 Medicines:                Monitored Anesthesia Care Procedure:                Pre-Anesthesia Assessment:                           - Prior to the procedure, a History and Physical                            was performed, and patient medications and                            allergies were reviewed. The patient's tolerance of                            previous anesthesia was also reviewed. The risks                            and benefits of the procedure and the sedation                            options and risks were discussed with the patient.                            All questions were answered, and informed consent                            was obtained. Prior Anticoagulants: The patient has                            taken no previous anticoagulant or antiplatelet                            agents. ASA Grade Assessment: III - A patient with                            severe systemic disease. After reviewing the risks                            and benefits, the patient was deemed in  satisfactory condition to undergo the procedure.                           After obtaining informed consent, the colonoscope                            was passed under direct vision. Throughout the                            procedure, the patient's blood pressure, pulse, and                            oxygen saturations were monitored continuously. The                            Colonoscope was introduced through the anus and                        advanced to the the terminal ileum. The colonoscopy                            was performed without difficulty. The patient                            tolerated the procedure well. The quality of the                            bowel preparation was good. The terminal ileum,                            ileocecal valve, appendiceal orifice, and rectum                            were photographed. Scope In: 3:09:46 PM Scope Out: 3:24:53 PM Scope Withdrawal Time: 0 hours 12 minutes 33 seconds  Total Procedure Duration: 0 hours 15 minutes 7 seconds  Findings:                 The digital rectal exam was normal.                           The terminal ileum appeared normal.                           A 6 mm polyp was found in the cecum. The polyp was                            sessile. The polyp was removed with a cold snare.                            Resection and retrieval were complete.                           Three sessile polyps were found in the ascending  colon. The polyps were 4 to 8 mm in size. These                            polyps were removed with a cold snare. Resection                            and retrieval were complete.                           Multiple small and large-mouthed diverticula were                            found in the sigmoid colon, descending colon,                            distal transverse colon and ascending colon.                           Internal hemorrhoids were found during                            retroflexion. The hemorrhoids were small. Complications:            No immediate complications. Estimated Blood Loss:     Estimated blood loss was minimal. Impression:               - The examined portion of the ileum was normal.                           - One 6 mm polyp in the cecum, removed with a cold                            snare. Resected and retrieved.                           - Three 4 to 8 mm  polyps in the ascending colon,                            removed with a cold snare. Resected and retrieved.                           - Moderate diverticulosis in the sigmoid colon, in                            the descending colon, in the distal transverse                            colon and in the ascending colon.                           - Internal hemorrhoids. Recommendation:           - Patient has a contact number available for  emergencies. The signs and symptoms of potential                            delayed complications were discussed with the                            patient. Return to normal activities tomorrow.                            Written discharge instructions were provided to the                            patient.                           - Resume previous diet.                           - Continue present medications.                           - Await pathology results.                           - Repeat colonoscopy is recommended for                            surveillance. The colonoscopy date will be                            determined after pathology results from today's                            exam become available for review. Jerene Bears, MD 09/23/2016 3:36:45 PM This report has been signed electronically.

## 2016-09-23 NOTE — Op Note (Signed)
[facility] Patient Name: Whitney Jimenez Procedure Date: 09/23/2016 2:47 PM MRN: [id number] Endoscopist: Jerene Bears , MD Age: 61 Referring MD:  Date of Birth: 1955-06-08 Gender: Female Account #: [id number] Procedure:                Upper GI endoscopy Indications:              Epigastric abdominal pain, Previously treated for                            Helicobacter pylori Medicines:                Monitored Anesthesia Care Procedure:                Pre-Anesthesia Assessment:                           - Prior to the procedure, a History and Physical                            was performed, and patient medications and                            allergies were reviewed. The patient's tolerance of                            previous anesthesia was also reviewed. The risks                            and benefits of the procedure and the sedation                            options and risks were discussed with the patient.                            All questions were answered, and informed consent                            was obtained. Prior Anticoagulants: The patient has                            taken no previous anticoagulant or antiplatelet                            agents. ASA Grade Assessment: III - A patient with                            severe systemic disease. After reviewing the risks                            and benefits, the patient was deemed in                            satisfactory condition to undergo the procedure.  After obtaining informed consent, the endoscope was                            passed under direct vision. Throughout the                            procedure, the patient's blood pressure, pulse, and                            oxygen saturations were monitored continuously. The                            Model GIF-[id number] scope was introduced                            through the mouth, and advanced to  the second part                            of duodenum. The upper GI endoscopy was                            accomplished without difficulty. The patient                            tolerated the procedure well. Scope In: Scope Out: Findings:                 The examined esophagus was normal.                           A 2 cm hiatal hernia was present.                           A single 10 mm submucosal papule (nodule) with no                            bleeding was found in the gastric antrum. Biopsies                            in bite on bite fashion were taken with a cold                            forceps for histology.                           Mild inflammation characterized by erythema was                            found in the gastric antrum. Biopsies were taken                            with a cold forceps for histology and Helicobacter                            pylori  testing.                           The examined duodenum was normal. Complications:            No immediate complications. Estimated Blood Loss:     Estimated blood loss was minimal. Impression:               - Normal esophagus.                           - 2 cm hiatal hernia.                           - A single submucosal papule (nodule) found in the                            stomach. Biopsied.                           - Gastritis. Biopsied.                           - Normal examined duodenum. Recommendation:           - Patient has a contact number available for                            emergencies. The signs and symptoms of potential                            delayed complications were discussed with the                            patient. Return to normal activities tomorrow.                            Written discharge instructions were provided to the                            patient.                           - Resume previous diet.                           - Continue present medications.                            - Await pathology results. Jerene Bears, MD 09/23/2016 3:33:08 PM This report has been signed electronically.

## 2016-09-23 NOTE — Progress Notes (Signed)
Dental advisory given to [place] and oriented x3, pleased with MAC, report to [employer]

## 2016-09-23 NOTE — Progress Notes (Signed)
Called to room to assist during endoscopic procedure.  Patient ID and intended procedure confirmed with present staff. Received instructions for my participation in the procedure from the performing physician.  

## 2016-09-24 ENCOUNTER — Telehealth: Payer: Self-pay

## 2016-09-24 ENCOUNTER — Telehealth: Payer: Self-pay | Admitting: *Deleted

## 2016-09-24 NOTE — Telephone Encounter (Signed)

## 2016-09-24 NOTE — Telephone Encounter (Signed)
Did not leave message on f/u call due to number and permission to leave message was not addressed in admitting the day of procedure.

## 2016-09-29 ENCOUNTER — Encounter: Payer: Self-pay | Admitting: Internal Medicine

## 2016-10-05 ENCOUNTER — Telehealth: Payer: Self-pay | Admitting: Internal Medicine

## 2016-10-05 NOTE — Telephone Encounter (Signed)
Pt has bladder cancer. Concerned due to adenomatous polyps. She received letter stating she would need a recall in 3 years. Pt wants to know if she should have the recall colon done sooner since she has bladder cancer. Please advise.

## 2016-10-05 NOTE — Telephone Encounter (Signed)
Spoke with pt and she is aware. Copy of path report mailed to pt.

## 2016-10-05 NOTE — Telephone Encounter (Signed)
Guidelines support 3 year surveillance interval, which I think is appropriate for her even in the setting of her bladder cancer. The polyps removed though multiple, were not advanced, and her previous surveillance interval was 4 years 2 years can be discussed closer to that time if she feels very strongly, though I remain confident with the 3 yr recommendation
